# Patient Record
Sex: Female | Born: 1975
Health system: Southern US, Community
[De-identification: ages and names within clinical notes are randomized; demographics above are authoritative.]

## PROBLEM LIST (undated history)

## (undated) DIAGNOSIS — Z1501 Genetic susceptibility to malignant neoplasm of breast: Secondary | ICD-10-CM

## (undated) DIAGNOSIS — T7840XA Allergy, unspecified, initial encounter: Secondary | ICD-10-CM

## (undated) DIAGNOSIS — L409 Psoriasis, unspecified: Secondary | ICD-10-CM

## (undated) DIAGNOSIS — W57XXXA Bitten or stung by nonvenomous insect and other nonvenomous arthropods, initial encounter: Secondary | ICD-10-CM

## (undated) DIAGNOSIS — M722 Plantar fascial fibromatosis: Secondary | ICD-10-CM

## (undated) DIAGNOSIS — S90569A Insect bite (nonvenomous), unspecified ankle, initial encounter: Secondary | ICD-10-CM

## (undated) HISTORY — PX: WISDOM TOOTH EXTRACTION: SHX21

## (undated) HISTORY — DX: Allergy, unspecified, initial encounter: T78.40XA

## (undated) HISTORY — DX: Genetic susceptibility to malignant neoplasm of breast: Z15.01

## (undated) HISTORY — DX: Plantar fascial fibromatosis: M72.2

## (undated) HISTORY — DX: Psoriasis, unspecified: L40.9

## (undated) HISTORY — DX: Bitten or stung by nonvenomous insect and other nonvenomous arthropods, initial encounter: W57.XXXA

## (undated) HISTORY — DX: Insect bite (nonvenomous), unspecified ankle, initial encounter: S90.569A

---

## 2004-05-10 HISTORY — PX: ABDOMINOPLASTY: SUR9

## 2014-06-10 HISTORY — PX: ABDOMINAL HYSTERECTOMY: SHX81

## 2014-06-11 DIAGNOSIS — D509 Iron deficiency anemia, unspecified: Secondary | ICD-10-CM

## 2014-06-11 HISTORY — DX: Iron deficiency anemia, unspecified: D50.9

## 2015-03-11 HISTORY — PX: BREAST ENHANCEMENT SURGERY: SHX7

## 2015-03-11 HISTORY — PX: MASTECTOMY: SHX3

## 2015-05-08 DIAGNOSIS — Z9013 Acquired absence of bilateral breasts and nipples: Secondary | ICD-10-CM

## 2015-05-08 HISTORY — DX: Acquired absence of bilateral breasts and nipples: Z90.13

## 2017-02-07 HISTORY — PX: ROTATOR CUFF REPAIR: SHX139

## 2018-02-21 DIAGNOSIS — E6609 Other obesity due to excess calories: Secondary | ICD-10-CM | POA: Insufficient documentation

## 2018-02-21 DIAGNOSIS — E669 Obesity, unspecified: Secondary | ICD-10-CM | POA: Insufficient documentation

## 2018-12-26 DIAGNOSIS — Z1501 Genetic susceptibility to malignant neoplasm of breast: Secondary | ICD-10-CM | POA: Insufficient documentation

## 2018-12-26 DIAGNOSIS — Z1509 Genetic susceptibility to other malignant neoplasm: Secondary | ICD-10-CM | POA: Insufficient documentation

## 2018-12-26 HISTORY — DX: Genetic susceptibility to other malignant neoplasm: Z15.09

## 2018-12-26 HISTORY — DX: Genetic susceptibility to malignant neoplasm of breast: Z15.01

## 2019-11-08 DIAGNOSIS — T63001A Toxic effect of unspecified snake venom, accidental (unintentional), initial encounter: Secondary | ICD-10-CM

## 2019-11-08 HISTORY — DX: Toxic effect of unspecified snake venom, accidental (unintentional), initial encounter: T63.001A

## 2020-04-25 DIAGNOSIS — L723 Sebaceous cyst: Secondary | ICD-10-CM | POA: Diagnosis not present

## 2020-04-25 DIAGNOSIS — D485 Neoplasm of uncertain behavior of skin: Secondary | ICD-10-CM | POA: Diagnosis not present

## 2020-04-25 DIAGNOSIS — L400 Psoriasis vulgaris: Secondary | ICD-10-CM | POA: Diagnosis not present

## 2020-05-05 ENCOUNTER — Other Ambulatory Visit: Payer: Self-pay

## 2020-05-05 ENCOUNTER — Ambulatory Visit (HOSPITAL_COMMUNITY)
Admission: RE | Admit: 2020-05-05 | Discharge: 2020-05-05 | Disposition: A | Discharge status: Home / Self Care | Payer: 59 | Source: Ambulatory Visit

## 2020-05-06 DIAGNOSIS — R300 Dysuria: Secondary | ICD-10-CM | POA: Diagnosis not present

## 2020-09-10 DIAGNOSIS — W57XXXA Bitten or stung by nonvenomous insect and other nonvenomous arthropods, initial encounter: Secondary | ICD-10-CM | POA: Diagnosis not present

## 2020-09-10 DIAGNOSIS — R030 Elevated blood-pressure reading, without diagnosis of hypertension: Secondary | ICD-10-CM | POA: Diagnosis not present

## 2020-09-10 DIAGNOSIS — S90561A Insect bite (nonvenomous), right ankle, initial encounter: Secondary | ICD-10-CM | POA: Diagnosis not present

## 2020-09-23 DIAGNOSIS — L710 Perioral dermatitis: Secondary | ICD-10-CM | POA: Diagnosis not present

## 2020-09-23 DIAGNOSIS — L400 Psoriasis vulgaris: Secondary | ICD-10-CM | POA: Diagnosis not present

## 2020-09-23 DIAGNOSIS — L811 Chloasma: Secondary | ICD-10-CM | POA: Diagnosis not present

## 2020-09-29 DIAGNOSIS — Z20822 Contact with and (suspected) exposure to covid-19: Secondary | ICD-10-CM | POA: Diagnosis not present

## 2020-09-30 DIAGNOSIS — Z20822 Contact with and (suspected) exposure to covid-19: Secondary | ICD-10-CM | POA: Diagnosis not present

## 2020-10-16 DIAGNOSIS — Z803 Family history of malignant neoplasm of breast: Secondary | ICD-10-CM | POA: Diagnosis not present

## 2020-10-16 DIAGNOSIS — T8542XA Displacement of breast prosthesis and implant, initial encounter: Secondary | ICD-10-CM | POA: Diagnosis not present

## 2020-10-16 DIAGNOSIS — T85848A Pain due to other internal prosthetic devices, implants and grafts, initial encounter: Secondary | ICD-10-CM | POA: Diagnosis not present

## 2020-10-16 DIAGNOSIS — N644 Mastodynia: Secondary | ICD-10-CM | POA: Diagnosis not present

## 2020-10-16 DIAGNOSIS — R222 Localized swelling, mass and lump, trunk: Secondary | ICD-10-CM | POA: Diagnosis not present

## 2020-10-16 DIAGNOSIS — Z9013 Acquired absence of bilateral breasts and nipples: Secondary | ICD-10-CM | POA: Diagnosis not present

## 2020-10-16 DIAGNOSIS — Z1501 Genetic susceptibility to malignant neoplasm of breast: Secondary | ICD-10-CM | POA: Diagnosis not present

## 2020-10-16 DIAGNOSIS — Z87891 Personal history of nicotine dependence: Secondary | ICD-10-CM | POA: Diagnosis not present

## 2020-12-18 DIAGNOSIS — Z45811 Encounter for adjustment or removal of right breast implant: Secondary | ICD-10-CM | POA: Diagnosis not present

## 2020-12-18 DIAGNOSIS — Z1501 Genetic susceptibility to malignant neoplasm of breast: Secondary | ICD-10-CM | POA: Diagnosis not present

## 2020-12-18 DIAGNOSIS — Z87891 Personal history of nicotine dependence: Secondary | ICD-10-CM | POA: Diagnosis not present

## 2020-12-18 DIAGNOSIS — Z9882 Breast implant status: Secondary | ICD-10-CM | POA: Diagnosis not present

## 2020-12-18 HISTORY — PX: BREAST SURGERY: SHX581

## 2021-01-15 ENCOUNTER — Other Ambulatory Visit (HOSPITAL_COMMUNITY): Payer: Self-pay

## 2021-01-15 MED ORDER — MONTELUKAST SODIUM 10 MG PO TABS
10.0000 mg | ORAL_TABLET | ORAL | 0 refills | Status: DC
Start: 2020-11-20 — End: 2021-05-13
  Filled 2021-01-15: qty 30, 60d supply, fill #0

## 2021-01-19 ENCOUNTER — Other Ambulatory Visit (HOSPITAL_COMMUNITY): Payer: Self-pay

## 2021-02-06 ENCOUNTER — Other Ambulatory Visit: Payer: Self-pay

## 2021-02-06 ENCOUNTER — Ambulatory Visit: Payer: 59 | Admitting: Family Medicine

## 2021-02-06 ENCOUNTER — Encounter: Payer: Self-pay | Admitting: Family Medicine

## 2021-02-06 VITALS — BP 138/82 | HR 79 | Temp 98.2°F | Ht 67.0 in | Wt 229.0 lb

## 2021-02-06 DIAGNOSIS — Z0000 Encounter for general adult medical examination without abnormal findings: Secondary | ICD-10-CM

## 2021-02-06 DIAGNOSIS — R030 Elevated blood-pressure reading, without diagnosis of hypertension: Secondary | ICD-10-CM | POA: Diagnosis not present

## 2021-02-06 DIAGNOSIS — L409 Psoriasis, unspecified: Secondary | ICD-10-CM | POA: Diagnosis not present

## 2021-02-06 DIAGNOSIS — Z1211 Encounter for screening for malignant neoplasm of colon: Secondary | ICD-10-CM

## 2021-02-06 DIAGNOSIS — Z01419 Encounter for gynecological examination (general) (routine) without abnormal findings: Secondary | ICD-10-CM | POA: Diagnosis not present

## 2021-02-06 DIAGNOSIS — Z1509 Genetic susceptibility to other malignant neoplasm: Secondary | ICD-10-CM | POA: Diagnosis not present

## 2021-02-06 DIAGNOSIS — E785 Hyperlipidemia, unspecified: Secondary | ICD-10-CM | POA: Insufficient documentation

## 2021-02-06 DIAGNOSIS — E559 Vitamin D deficiency, unspecified: Secondary | ICD-10-CM | POA: Insufficient documentation

## 2021-02-06 DIAGNOSIS — Z1501 Genetic susceptibility to malignant neoplasm of breast: Secondary | ICD-10-CM | POA: Diagnosis not present

## 2021-02-06 DIAGNOSIS — Z13220 Encounter for screening for lipoid disorders: Secondary | ICD-10-CM

## 2021-02-06 DIAGNOSIS — Z1507 Genetic susceptibility to malignant neoplasm of urinary tract: Secondary | ICD-10-CM

## 2021-02-06 DIAGNOSIS — Z713 Dietary counseling and surveillance: Secondary | ICD-10-CM | POA: Insufficient documentation

## 2021-02-06 HISTORY — DX: Hyperlipidemia, unspecified: E78.5

## 2021-02-06 NOTE — Assessment & Plan Note (Signed)
Active issue with involvement primarily at the left elbow/proximal extensor forearm, lesser so to the right elbow, scattered patches elsewhere.  She is interested in a referral to dermatology, one was placed today in that regard.  We will follow peripherally on this issue.

## 2021-02-06 NOTE — Assessment & Plan Note (Signed)
Annual examination complete risk stratification labs ordered.  A referral to gastroenterology was placed for colon cancer screening, additionally she plans to establish with a local OB/GYN.  Anticipatory guidance was provided to the patient today.

## 2021-02-06 NOTE — Progress Notes (Signed)
Annual Physical Exam Visit  Patient Information:  Patient ID: Brooke Graves, female DOB: [birth year] Age: 45 y.o. MRN: [id number]   Subjective:   CC: Annual Physical Exam  HPI:  Brooke Graves is here for their annual physical.  I reviewed the past medical history, family history, social history, surgical history, and allergies today and changes were made as necessary.  Please see the problem list section below for additional details.  Past Medical History: Past Medical History:  Diagnosis Date   Acquired absence of both breasts 05/08/2015   Allergy    BRCA positive    BRCA2 gene mutation positive 12/26/2018   Hyperlipidemia 02/06/2021   Iron deficiency anemia 06/11/2014   Plantar fasciitis    bilateral   Poisoning, snake bite 11/2019   [place]   Psoriasis    Tick bite of ankle    right   Past Surgical History: Past Surgical History:  Procedure Laterality Date   ABDOMINAL HYSTERECTOMY  06/2014   ABDOMINOPLASTY  2006   BREAST ENHANCEMENT SURGERY Bilateral 03/2015   BREAST SURGERY Bilateral 12/18/2020   Revision   MASTECTOMY Bilateral 03/2015   ROTATOR CUFF REPAIR Right 02/2017   WISDOM TOOTH EXTRACTION Bilateral    Family History: Family History  Problem Relation Age of Onset   Breast cancer Mother    Congenital heart disease Brother    Ovarian cancer Maternal Aunt    Kidney cancer Maternal Aunt    Breast cancer Maternal Grandmother    Allergies: Allergies  Allergen Reactions   Sulfamethoxazole-Trimethoprim Rash and Palpitations   Lactase-Lactobacillus Other (See Comments)    GI INTOLERANCE   Pork-Derived Products Other (See Comments)    GI INTOLERANCE   Tape Rash   Wound Dressing Adhesive Rash   Health Maintenance: Health Maintenance  Topic Date Due   COVID-19 Vaccine (1) Never done   HIV Screening  Never done   Hepatitis C Screening  Never done   TETANUS/TDAP  Never done   COLONOSCOPY (Pts 45-36yrs Insurance coverage will  need to be confirmed)  Never done   INFLUENZA VACCINE  12/08/2020   HPV VACCINES  Aged Out   PAP SMEAR-Modifier  Discontinued    HM Colonoscopy          Overdue - COLONOSCOPY (Pts 45-53yrs Insurance coverage will need to be confirmed) (Every 10 Years) Overdue - never done    No completion history exists for this topic.           Medications: Current Outpatient Medications on File Prior to Visit  Medication Sig Dispense Refill   montelukast (SINGULAIR) 10 MG tablet Take 1 tablet (10 mg total) by mouth every other day. 45 tablet 0   No current facility-administered medications on file prior to visit.    Review of Systems: No headache, visual changes, nausea, vomiting, diarrhea, constipation, dizziness, abdominal pain, +skin rash, fevers, chills, night sweats, swollen lymph nodes, weight loss, chest pain, body aches, joint swelling, muscle aches, shortness of breath, mood changes, visual or auditory hallucinations reported.  Objective:   Vitals:   02/06/21 1527 02/06/21 1622  BP: (!) 184/112 138/82  Pulse:    Temp:    SpO2:     Vitals:   02/06/21 1520  Weight: 229 lb (103.9 kg)  Height: $Remove'5\' 7"'kFCYweV$  (1.702 m)   Body mass index is 35.87 kg/m.  General: Well Developed, well nourished, and in no acute distress.  Neuro: Alert and oriented x3, extra-ocular muscles intact, sensation grossly  intact. Cranial nerves II through XII are intact, motor, sensory, and coordinative functions are all intact. HEENT: Normocephalic, atraumatic, pupils equal round reactive to light, neck supple, no masses, no lymphadenopathy, thyroid nonpalpable.  Mallampati class II, oropharynx, nasopharynx, external ear canals are unremarkable. Skin: Warm and dry, psoriasiform rash at left elbow/proximal forearm, right elbow.  Hyperpigmented scars at right abdomen Cardiac: Regular rate and rhythm, no murmurs rubs or gallops. No peripheral edema. Pulses symmetric. Respiratory: Clear to auscultation bilaterally.  Not using accessory muscles, speaking in full sentences.  Abdominal: Soft, nontender, nondistended, positive bowel sounds, no masses, no organomegaly. Musculoskeletal: Shoulder, elbow, wrist, hip, knee, ankle stable, and with full range of motion.  Female chaperone initials: BN present throughout the physical examination.  Impression and Recommendations:   The patient was counselled, risk factors were discussed, and anticipatory guidance given.  Psoriasis Active issue with involvement primarily at the left elbow/proximal extensor forearm, lesser so to the right elbow, scattered patches elsewhere.  She is interested in a referral to dermatology, one was placed today in that regard.  We will follow peripherally on this issue.  Elevated blood-pressure reading without diagnosis of hypertension Incidentally noted on serial measurements, she is asymptomatic from a cardiovascular standpoint.  Risk stratification labs were ordered, will have the patient return in 4 weeks for reevaluation of this.  Annual physical exam Annual examination complete risk stratification labs ordered.  A referral to gastroenterology was placed for colon cancer screening, additionally she plans to establish with a local OB/GYN.  Anticipatory guidance was provided to the patient today.  Orders & Medications Medications: No orders of the defined types were placed in this encounter.  Orders Placed This Encounter  Procedures   TSH Rfx on Abnormal to Free T4   Lipid panel   Comprehensive metabolic panel   CBC   Ambulatory referral to Dermatology   Ambulatory referral to Gastroenterology   Ambulatory referral to Gynecology     Return in about 4 weeks (around 03/06/2021).    Montel Culver, MD   [facility] [place]

## 2021-02-06 NOTE — Assessment & Plan Note (Signed)
Incidentally noted on serial measurements, she is asymptomatic from a cardiovascular standpoint.  Risk stratification labs were ordered, will have the patient return in 4 weeks for reevaluation of this.

## 2021-02-06 NOTE — Patient Instructions (Addendum)
-   Obtain fasting labs with orders provided (can have water or black coffee but otherwise no food or drink x 8 hours before labs) - Referral coordinator will contact you in regards to follow-up with dermatology, gastroenterology - Review information provided - Attend eye doctor annually, dentist every 6 months, work towards or maintain 30 minutes of moderate intensity physical activity at least 5 days per week, and consume a balanced diet - Contact us for any questions between now and then

## 2021-03-05 ENCOUNTER — Ambulatory Visit: Payer: 59 | Admitting: Family Medicine

## 2021-03-05 DIAGNOSIS — Z13220 Encounter for screening for lipoid disorders: Secondary | ICD-10-CM | POA: Diagnosis not present

## 2021-03-05 DIAGNOSIS — R030 Elevated blood-pressure reading, without diagnosis of hypertension: Secondary | ICD-10-CM | POA: Diagnosis not present

## 2021-03-05 DIAGNOSIS — Z0000 Encounter for general adult medical examination without abnormal findings: Secondary | ICD-10-CM | POA: Diagnosis not present

## 2021-03-06 ENCOUNTER — Ambulatory Visit: Payer: 59 | Admitting: Family Medicine

## 2021-03-06 LAB — LIPID PANEL
Chol/HDL Ratio: 3.3 ratio (ref 0.0–4.4)
Cholesterol, Total: 207 mg/dL — ABNORMAL HIGH (ref 100–199)
HDL: 63 mg/dL (ref >39)
LDL Chol Calc (NIH): 128 mg/dL — ABNORMAL HIGH (ref 0–99)
Triglycerides: 89 mg/dL (ref 0–149)
VLDL Cholesterol Cal: 16 mg/dL (ref 5–40)

## 2021-03-06 LAB — CBC
Hematocrit: 46.8 % — ABNORMAL HIGH (ref 34.0–46.6)
Hemoglobin: 15.4 g/dL (ref 11.1–15.9)
MCH: 27.2 pg (ref 26.6–33.0)
MCHC: 32.9 g/dL (ref 31.5–35.7)
MCV: 83 fL (ref 79–97)
Platelets: 304 10*3/uL (ref 150–450)
RBC: 5.67 x10E6/uL — ABNORMAL HIGH (ref 3.77–5.28)
RDW: 12.7 % (ref 11.7–15.4)
WBC: 5.5 10*3/uL (ref 3.4–10.8)

## 2021-03-06 LAB — COMPREHENSIVE METABOLIC PANEL
ALT: 21 IU/L (ref 0–32)
AST: 24 IU/L (ref 0–40)
Albumin/Globulin Ratio: 1.9 (ref 1.2–2.2)
Albumin: 4.8 g/dL (ref 3.8–4.8)
Alkaline Phosphatase: 103 IU/L (ref 44–121)
BUN/Creatinine Ratio: 10 (ref 9–23)
BUN: 9 mg/dL (ref 6–24)
Bilirubin Total: 0.5 mg/dL (ref 0.0–1.2)
CO2: 23 mmol/L (ref 20–29)
Calcium: 9.1 mg/dL (ref 8.7–10.2)
Chloride: 103 mmol/L (ref 96–106)
Creatinine, Ser: 0.86 mg/dL (ref 0.57–1.00)
Globulin, Total: 2.5 g/dL (ref 1.5–4.5)
Glucose: 90 mg/dL (ref 70–99)
Potassium: 4.3 mmol/L (ref 3.5–5.2)
Sodium: 141 mmol/L (ref 134–144)
Total Protein: 7.3 g/dL (ref 6.0–8.5)
eGFR: 84 mL/min/{1.73_m2} (ref >59)

## 2021-03-06 LAB — TSH RFX ON ABNORMAL TO FREE T4: TSH: 1.61 u[IU]/mL (ref 0.450–4.500)

## 2021-03-12 ENCOUNTER — Other Ambulatory Visit: Payer: Self-pay

## 2021-03-12 ENCOUNTER — Other Ambulatory Visit (HOSPITAL_COMMUNITY): Payer: Self-pay

## 2021-03-12 ENCOUNTER — Ambulatory Visit: Payer: 59 | Admitting: Family Medicine

## 2021-03-12 ENCOUNTER — Encounter: Payer: Self-pay | Admitting: Family Medicine

## 2021-03-12 VITALS — BP 104/74 | HR 72 | Temp 98.6°F | Ht 67.0 in | Wt 223.0 lb

## 2021-03-12 DIAGNOSIS — R030 Elevated blood-pressure reading, without diagnosis of hypertension: Secondary | ICD-10-CM

## 2021-03-12 DIAGNOSIS — R439 Unspecified disturbances of smell and taste: Secondary | ICD-10-CM | POA: Diagnosis not present

## 2021-03-12 DIAGNOSIS — Z113 Encounter for screening for infections with a predominantly sexual mode of transmission: Secondary | ICD-10-CM | POA: Insufficient documentation

## 2021-03-12 MED ORDER — FLUTICASONE PROPIONATE 50 MCG/ACT NA SUSP
2.0000 | Freq: Every day | NASAL | 0 refills | Status: DC
  Filled 2021-03-12: qty 16, 30d supply, fill #0

## 2021-03-12 NOTE — Assessment & Plan Note (Signed)
Patient starting new relationship and is asymptomatic, requesting routine screening for STI, these labs were ordered today.

## 2021-03-12 NOTE — Patient Instructions (Signed)
-   Obtain labs with orders provided, our office will contact you with results once available - Start intranasal OTC saline spray, following this use Flonase (fluticasone propionate) 2 sprays in each nostril daily x2-4 weeks - Follow-up with ENT -Return for follow-up in our office in 3-6 months, contact for questions

## 2021-03-12 NOTE — Progress Notes (Signed)
Primary Care / Sports Medicine Office Visit  Patient Information:  Patient ID: Brooke Graves, female DOB: 13-May-1975 Age: 45 y.o. MRN: [id number]   Brooke Graves is a pleasant 45 y.o. female presenting with the following:  Chief Complaint  Patient presents with   Follow-up    Reevaluate blood pressure    Review of Systems pertinent details above   Patient Active Problem List   Diagnosis Date Noted   Sense of smell altered 03/12/2021   Routine screening for STI (sexually transmitted infection) 03/12/2021   Elevated blood-pressure reading without diagnosis of hypertension 02/06/2021   Dietary counseling and surveillance 02/06/2021   Vitamin D deficiency 02/06/2021   Annual physical exam 02/06/2021   Psoriasis    Obesity 02/21/2018   Past Medical History:  Diagnosis Date   Acquired absence of both breasts 05/08/2015   Allergy    BRCA positive    BRCA2 gene mutation positive 12/26/2018   Hyperlipidemia 02/06/2021   Iron deficiency anemia 06/11/2014   Plantar fasciitis    bilateral   Poisoning, snake bite 11/2019   [place]   Psoriasis    Tick bite of ankle    right   Outpatient Encounter Medications as of 03/12/2021  Medication Sig   fluticasone (FLONASE) 50 MCG/ACT nasal spray Place 2 sprays into both nostrils daily.   BLACK COHOSH PO Take 1 capsule by mouth daily. (Patient not taking: Reported on 03/12/2021)   montelukast (SINGULAIR) 10 MG tablet Take 1 tablet (10 mg total) by mouth every other day. (Patient not taking: Reported on 03/12/2021)   PARoxetine (PAXIL) 20 MG tablet Take 20 mg by mouth daily. (Patient not taking: Reported on 03/12/2021)   VITAMIN D PO Take 1 capsule by mouth daily. (Patient not taking: Reported on 03/12/2021)   No facility-administered encounter medications on file as of 03/12/2021.   Past Surgical History:  Procedure Laterality Date   ABDOMINAL HYSTERECTOMY  06/2014   ABDOMINOPLASTY  2006   BREAST ENHANCEMENT  SURGERY Bilateral 03/2015   BREAST SURGERY Bilateral 12/18/2020   Revision   MASTECTOMY Bilateral 03/2015   ROTATOR CUFF REPAIR Right 02/2017   WISDOM TOOTH EXTRACTION Bilateral     Vitals:   03/12/21 1032  BP: 104/74  Pulse: 72  Temp: 98.6 F (37 C)  SpO2: 97%   Vitals:   03/12/21 1032  Weight: 223 lb (101.2 kg)  Height: $Remove'5\' 7"'ThFrGyo$  (1.702 m)   Body mass index is 34.93 kg/m.  No results found.   Independent interpretation of notes and tests performed by another provider:   None  Procedures performed:   None  Pertinent History, Exam, Impression, and Recommendations:   Elevated blood-pressure reading without diagnosis of hypertension Previously noted on prior exam, she remains asymptomatic, cardiopulmonary findings on examination are benign as his her blood pressure measured today.  We will follow this issue in the future.  Sense of smell altered Patient states that roughly 2 years prior, following COVID at that time, she had anosmia, since that time she has noted gradual partial return of her sense of smell though has noted alteration in it.  She states that certain items/foods (onions, cilantro, laundry detergents, perfumes, etc.) now are somewhat noxious where they previously had not been.  Examination today reveals subtle tenderness at the frontal and ethmoid sinus ease, left turbinate swelling and mild erythema, right with mild erythema along bilateral tympanic membranes and canals benign, oropharynx benign.  Have advised initial management with saline, intranasal  steroid, and pending residual symptomatology, follow-up with ENT.  A referral was placed in that regard today.  Routine screening for STI (sexually transmitted infection) Patient starting new relationship and is asymptomatic, requesting routine screening for STI, these labs were ordered today.   Orders & Medications Meds ordered this encounter  Medications   fluticasone (FLONASE) 50 MCG/ACT nasal spray     Sig: Place 2 sprays into both nostrils daily.    Dispense:  16 g    Refill:  0   Orders Placed This Encounter  Procedures   Chlamydia/Gonococcus/Trichomonas, NAA   HIV antibody (with reflex)   Hepatitis C Antibody   RPR   Ambulatory referral to ENT     Return in about 3 months (around 06/12/2021) for 3-6 month follow-up .     Montel Culver, MD   [facility] [place]

## 2021-03-12 NOTE — Assessment & Plan Note (Signed)
Previously noted on prior exam, she remains asymptomatic, cardiopulmonary findings on examination are benign as his her blood pressure measured today.  We will follow this issue in the future.

## 2021-03-12 NOTE — Assessment & Plan Note (Signed)
Patient states that roughly 2 years prior, following COVID at that time, she had anosmia, since that time she has noted gradual partial return of her sense of smell though has noted alteration in it.  She states that certain items/foods (onions, cilantro, laundry detergents, perfumes, etc.) now are somewhat noxious where they previously had not been.  Examination today reveals subtle tenderness at the frontal and ethmoid sinus ease, left turbinate swelling and mild erythema, right with mild erythema along bilateral tympanic membranes and canals benign, oropharynx benign.  Have advised initial management with saline, intranasal steroid, and pending residual symptomatology, follow-up with ENT.  A referral was placed in that regard today.

## 2021-03-13 LAB — HIV ANTIBODY (ROUTINE TESTING W REFLEX): HIV Screen 4th Generation wRfx: NONREACTIVE

## 2021-03-13 LAB — HEPATITIS C ANTIBODY: Hep C Virus Ab: <0.1 s/co ratio (ref 0.0–0.9)

## 2021-03-13 LAB — RPR: RPR Ser Ql: NONREACTIVE

## 2021-03-14 LAB — CHLAMYDIA/GONOCOCCUS/TRICHOMONAS, NAA
Chlamydia by NAA: NEGATIVE
Gonococcus by NAA: NEGATIVE
Trich vag by NAA: NEGATIVE

## 2021-03-20 ENCOUNTER — Other Ambulatory Visit (HOSPITAL_COMMUNITY): Payer: Self-pay

## 2021-05-12 ENCOUNTER — Ambulatory Visit: Payer: Self-pay

## 2021-05-12 NOTE — Telephone Encounter (Signed)
Patient called, left VM to return the call to the office to discuss symptoms with a nurse.    Summary: sore throat, loss of voice since 05/07/21   Patient called in to say that she have scheduled an appointment for 05/13/21 but would like something today if possible. Took a Covid test on 05/05/21 which was positive but have no more symptoms except cough, sore throat  and loss of voice since 05/07/21. Taking 600 MG ibuprofen every 6 hrs and Tylenol. Asking for a call back at Ph# [phone]

## 2021-05-12 NOTE — Telephone Encounter (Signed)
°  Chief Complaint: sore throat Symptoms: sore throat, cough, hoarse voice Frequency: 6-7 days Pertinent Negatives: Patient denies fever Disposition: [] ED /[] Urgent Care (no appt availability in office) / [x] Appointment(In office/virtual)/ []  [facility]/ [] Home Care/ [] Refused Recommended Disposition /[] [facility]/ []  Follow-up with PCP Additional Notes: Agent had already scheduled pt for VV at 05/13/21 1140. Advised pt I can schedule for virtual UC visit if she cant wait until visit tomorrow but pt wants to wait. Care advice given and pt verbalized understanding. No other questions/concerns noted.     Reason for Disposition  [1] Sore throat with cough/cold symptoms AND [2] present > 5 days  Answer Assessment - Initial Assessment Questions 1. ONSET: "When did the throat start hurting?" (Hours or days ago)      05/05/21 2. SEVERITY: "How bad is the sore throat?" (Scale 1-10; mild, moderate or severe)   - MILD (1-3):  doesn't interfere with eating or normal activities   - MODERATE (4-7): interferes with eating some solids and normal activities   - SEVERE (8-10):  excruciating pain, interferes with most normal activities   - SEVERE DYSPHAGIA: can't swallow liquids, drooling     mild 4.  VIRAL SYMPTOMS: "Are there any symptoms of a cold, such as a runny nose, cough, hoarse voice or red eyes?"      Hoarse voice, cough  5. FEVER: "Do you have a fever?" If Yes, ask: "What is your temperature, how was it measured, and when did it start?"     no 6. PUS ON THE TONSILS: "Is there pus on the tonsils in the back of your throat?"     No 7. OTHER SYMPTOMS: "Do you have any other symptoms?" (e.g., difficulty breathing, headache, rash)     No  Protocols used: Sore Throat-A-AH

## 2021-05-13 ENCOUNTER — Encounter: Payer: Self-pay | Admitting: Family Medicine

## 2021-05-13 ENCOUNTER — Telehealth (INDEPENDENT_AMBULATORY_CARE_PROVIDER_SITE_OTHER): Payer: 59 | Admitting: Family Medicine

## 2021-05-13 ENCOUNTER — Other Ambulatory Visit (HOSPITAL_COMMUNITY): Payer: Self-pay

## 2021-05-13 ENCOUNTER — Other Ambulatory Visit: Payer: Self-pay

## 2021-05-13 VITALS — Temp 97.9°F | Ht 67.0 in | Wt 224.0 lb

## 2021-05-13 DIAGNOSIS — J040 Acute laryngitis: Secondary | ICD-10-CM | POA: Insufficient documentation

## 2021-05-13 MED ORDER — METHYLPREDNISOLONE 4 MG PO TBPK
ORAL_TABLET | ORAL | 0 refills | Status: DC
  Filled 2021-05-13: qty 21, 6d supply, fill #0

## 2021-05-13 MED ORDER — PROMETHAZINE-DM 6.25-15 MG/5ML PO SYRP
5.0000 mL | ORAL_SOLUTION | Freq: Four times a day (QID) | ORAL | 0 refills | Status: DC | PRN
  Filled 2021-05-13: qty 118, 6d supply, fill #0

## 2021-05-13 NOTE — Patient Instructions (Addendum)
-   Take Medrol Dosepak for full 6-day course - Can use cough medicine every 6 hours on as-needed basis - Get plenty of rest, stay hydrated, and review information provided on other supportive measures - Contact our office next week if symptoms fail to improve

## 2021-05-13 NOTE — Progress Notes (Signed)
Primary Care / Sports Medicine Virtual Visit  Patient Information:  Patient ID: Brooke Graves, female DOB: 05-29-1975 Age: 46 y.o. MRN: [id number]   Der A Higuera Cassell Clement is a pleasant 46 y.o. female presenting with the following:  Chief Complaint  Patient presents with   Covid Positive    05/05/21   Hoarse    Experiencing laryngitis, sore throat, and cough since 05/07/21; using chloraseptic, lozenges, ibuprofen with minimal relief; 8/10 throat pain    Review of Systems: No fevers, chills, night sweats, weight loss, chest pain, or shortness of breath.   Patient Active Problem List   Diagnosis Date Noted   Acute laryngitis 05/13/2021   Sense of smell altered 03/12/2021   Routine screening for STI (sexually transmitted infection) 03/12/2021   Elevated blood-pressure reading without diagnosis of hypertension 02/06/2021   Dietary counseling and surveillance 02/06/2021   Vitamin D deficiency 02/06/2021   Annual physical exam 02/06/2021   Psoriasis    Obesity 02/21/2018   Past Medical History:  Diagnosis Date   Acquired absence of both breasts 05/08/2015   Allergy    BRCA positive    BRCA2 gene mutation positive 12/26/2018   Hyperlipidemia 02/06/2021   Iron deficiency anemia 06/11/2014   Plantar fasciitis    bilateral   Poisoning, snake bite 11/2019   [place]   Psoriasis    Tick bite of ankle    right   Outpatient Encounter Medications as of 05/13/2021  Medication Sig   methylPREDNISolone (MEDROL DOSEPAK) 4 MG TBPK tablet Take as directed on patient instructions   promethazine-dextromethorphan (PROMETHAZINE-DM) 6.25-15 MG/5ML syrup Take 5 mLs by mouth 4 (four) times daily as needed for cough.   Saline Spray 0.2 % SOLN Place 1 spray into the nose 3 (three) times daily as needed.   fluticasone (FLONASE) 50 MCG/ACT nasal spray Place 2 sprays into both nostrils daily. (Patient not taking: Reported on 05/13/2021)   [DISCONTINUED] BLACK COHOSH PO Take 1 capsule by  mouth daily. (Patient not taking: Reported on 03/12/2021)   [DISCONTINUED] montelukast (SINGULAIR) 10 MG tablet Take 1 tablet (10 mg total) by mouth every other day. (Patient not taking: Reported on 03/12/2021)   [DISCONTINUED] PARoxetine (PAXIL) 20 MG tablet Take 20 mg by mouth daily. (Patient not taking: Reported on 03/12/2021)   [DISCONTINUED] VITAMIN D PO Take 1 capsule by mouth daily. (Patient not taking: Reported on 03/12/2021)   No facility-administered encounter medications on file as of 05/13/2021.   Past Surgical History:  Procedure Laterality Date   ABDOMINAL HYSTERECTOMY  06/2014   ABDOMINOPLASTY  2006   BREAST ENHANCEMENT SURGERY Bilateral 03/2015   BREAST SURGERY Bilateral 12/18/2020   Revision   MASTECTOMY Bilateral 03/2015   ROTATOR CUFF REPAIR Right 02/2017   WISDOM TOOTH EXTRACTION Bilateral     Virtual Visit via MyChart Video:   I connected with Brooke Graves on 05/13/21 via MyChart Video and verified that I am speaking with the correct person using appropriate identifiers.   The limitations, risks, security and privacy concerns of performing an evaluation and management service by MyChart Video, including the higher likelihood of inaccurate diagnoses and treatments, and the availability of in person appointments were reviewed. The possible need of an additional face-to-face encounter for complete and high quality delivery of care was discussed. The patient was also made aware that there may be a patient responsible charge related to this service. The patient expressed understanding and wishes to proceed.  Provider location is in  medical facility. Patient location is at their home, different from provider location. People involved in care of the patient during this telehealth encounter were myself, my nurse/medical assistant, and my front office/scheduling team member.  Objective findings:   General: Speaking full sentences, hoarse voice, no audible heavy  breathing. Sounds alert and appropriately interactive. Mildly ill-appearing. Face symmetric. Extraocular movements intact. Pupils equal and round. No nasal flaring or accessory muscle use visualized.  Independent interpretation of notes and tests performed by another provider:   None  Pertinent History, Exam, Impression, and Recommendations:   Acute laryngitis Patient with clinical course involving symptoms that began on 05/04/2021 (malaise, fever, cough), took a home COVID test on 05/05/2021 which was positive.  Fevers have resolved x5 days but primary symptomatology has been throat pain, hoarseness, bilateral ear pain, congestion, nonproductive cough, mild sinus pressure without purulent discharge.  She denies any shortness of air, no other systemic complaints.  Given her clinical course concern for secondary/superimposed viral etiology resulting in laryngitis, could also be considered a sequelae of resolving COVID infection.  She has been dosing ibuprofen and supportive care with short-lived relief.  I discussed treatment strategies and she will start a course of methylprednisolone, as needed promethazine dextromethorphan, and continue supportive care.  She was advised to contact her office towards the beginning of next week if symptoms persist, at that time bacterial component can be considered and addressed with antibiotics.  She can otherwise follow-up as needed.   Orders & Medications Meds ordered this encounter  Medications   methylPREDNISolone (MEDROL DOSEPAK) 4 MG TBPK tablet    Sig: Take as directed on patient instructions    Dispense:  21 tablet    Refill:  0   promethazine-dextromethorphan (PROMETHAZINE-DM) 6.25-15 MG/5ML syrup    Sig: Take 5 mLs by mouth 4 (four) times daily as needed for cough.    Dispense:  118 mL    Refill:  0   No orders of the defined types were placed in this encounter.    I discussed the above assessment and treatment plan with the patient. The  patient was provided an opportunity to ask questions and all were answered. The patient agreed with the plan and demonstrated an understanding of the instructions.   The patient was advised to call back or seek an in-person evaluation if the symptoms worsen or if the condition fails to improve as anticipated.    Montel Culver, MD   Primary Care Sports [facility] [place]

## 2021-05-13 NOTE — Assessment & Plan Note (Signed)
Patient with clinical course involving symptoms that began on 05/04/2021 (malaise, fever, cough), took a home COVID test on 05/05/2021 which was positive.  Fevers have resolved x5 days but primary symptomatology has been throat pain, hoarseness, bilateral ear pain, congestion, nonproductive cough, mild sinus pressure without purulent discharge.  She denies any shortness of air, no other systemic complaints.  Given her clinical course concern for secondary/superimposed viral etiology resulting in laryngitis, could also be considered a sequelae of resolving COVID infection.  She has been dosing ibuprofen and supportive care with short-lived relief.  I discussed treatment strategies and she will start a course of methylprednisolone, as needed promethazine dextromethorphan, and continue supportive care.  She was advised to contact her office towards the beginning of next week if symptoms persist, at that time bacterial component can be considered and addressed with antibiotics.  She can otherwise follow-up as needed.

## 2021-06-22 ENCOUNTER — Other Ambulatory Visit (HOSPITAL_COMMUNITY): Payer: Self-pay

## 2021-06-22 DIAGNOSIS — L710 Perioral dermatitis: Secondary | ICD-10-CM | POA: Diagnosis not present

## 2021-06-22 DIAGNOSIS — L409 Psoriasis, unspecified: Secondary | ICD-10-CM | POA: Diagnosis not present

## 2021-06-25 ENCOUNTER — Other Ambulatory Visit (HOSPITAL_COMMUNITY): Payer: Self-pay

## 2021-06-25 MED ORDER — CLOBETASOL PROPIONATE 0.05 % EX OINT
1.0000 "application " | TOPICAL_OINTMENT | Freq: Two times a day (BID) | CUTANEOUS | 0 refills | Status: AC
  Filled 2021-06-25: qty 45, 14d supply, fill #0

## 2021-06-30 ENCOUNTER — Other Ambulatory Visit (HOSPITAL_COMMUNITY): Payer: Self-pay

## 2021-08-27 DIAGNOSIS — L719 Rosacea, unspecified: Secondary | ICD-10-CM | POA: Insufficient documentation

## 2021-08-28 ENCOUNTER — Encounter: Payer: Self-pay | Admitting: Family Medicine

## 2021-08-28 ENCOUNTER — Ambulatory Visit: Payer: 59 | Admitting: Family Medicine

## 2021-08-28 VITALS — BP 124/78 | HR 67 | Ht 67.0 in | Wt 224.6 lb

## 2021-08-28 DIAGNOSIS — Z7689 Persons encountering health services in other specified circumstances: Secondary | ICD-10-CM | POA: Diagnosis not present

## 2021-08-28 DIAGNOSIS — R030 Elevated blood-pressure reading, without diagnosis of hypertension: Secondary | ICD-10-CM | POA: Diagnosis not present

## 2021-08-28 DIAGNOSIS — R439 Unspecified disturbances of smell and taste: Secondary | ICD-10-CM

## 2021-08-28 DIAGNOSIS — Z6835 Body mass index (BMI) 35.0-35.9, adult: Secondary | ICD-10-CM | POA: Diagnosis not present

## 2021-08-28 MED ORDER — WEGOVY 0.5 MG/0.5ML ~~LOC~~ SOAJ: 0.5000 mg | SUBCUTANEOUS | 0 refills | Status: DC

## 2021-08-28 NOTE — Assessment & Plan Note (Signed)
Chronic condition for which patient has attempted multiple lifestyle changes that are ongoing including increased exercise, dietary modifications.  Despite this her BMI remains elevated at 35.18 as measured today.  She has trialed phentermine in the past but could not tolerate this medication due to adverse effects/medication sensitivity. ? ?Cardiopulmonary examination benign today, abdomen is soft, nontender, nondistended, normoactive bowel sounds noted without hepatosplenomegaly. ? ?We did discuss additional pharmacologic and nonpharmacologic treatment strategies today and she is amenable to initiation of Wegovy 0.25 mg administered weekly, first dose administered today.  Risk profile reviewed and need for adjunct continued lifestyle changes discussed.  Rx sent for 0.5 mg dose to initiate in 1 month, patient will return for formal visit at that time for weight check and assessment of medication tolerance. ? ?Chronic condition, symptomatic, Rx management ?

## 2021-08-28 NOTE — Assessment & Plan Note (Signed)
This has spontaneously resolved and given the patient's clinical timeline, can most likely be considered secondary to COVID infection. ?

## 2021-08-28 NOTE — Assessment & Plan Note (Signed)
See additional assessment(s) for plan details. 

## 2021-08-28 NOTE — Assessment & Plan Note (Signed)
Serially noted reassuring BP readings, she is asymptomatic from a cardiopulmonary standpoint ?

## 2021-08-28 NOTE — Patient Instructions (Signed)
-   Repeat Wegovy injections weekly ?- Review information attached to [place] visit ?- Return for follow-up in 4 weeks ?- [facility] nutritionist for additional information on dietary changes ?- Continue your current healthy lifestyle modifications from a dietary and exercise standpoint ?

## 2021-08-28 NOTE — Progress Notes (Signed)
?  ? ?  Primary Care / Sports Medicine Office Visit ? ?Patient Information:  ?Patient ID: Brooke Graves, female DOB: 1976/01/01 Age: 46 y.o. MRN: [id number]  ? ?Brooke Graves is a pleasant 46 y.o. female presenting with the following: ? ?Chief Complaint  ?Patient presents with  ? Follow-up  ?  Psoriasis-under control sees dermatology ?Sense of smell-thinks it has got better since having Covid in January  ?HBP-unsure   ? ? ?Vitals:  ? 08/28/21 1128  ?BP: 124/78  ?Pulse: 67  ?SpO2: 98%  ? ?Vitals:  ? 08/28/21 1128  ?Weight: 224 lb 9.6 oz (101.9 kg)  ?Height: '5\' 7"'$  (1.702 m)  ? ?Body mass index is 35.18 kg/m?. ? ?No results found.  ? ?Independent interpretation of notes and tests performed by another provider:  ? ?None ? ?Procedures performed:  ? ?None ? ?Pertinent History, Exam, Impression, and Recommendations:  ? ?Problem List Items Addressed This Visit   ? ?  ? Other  ? Elevated blood-pressure reading without diagnosis of hypertension  ?  Serially noted reassuring BP readings, she is asymptomatic from a cardiopulmonary standpoint ? ?  ?  ? Severe obesity (BMI 35.0-35.9 with comorbidity) ([place]) - Primary  ?  Chronic condition for which patient has attempted multiple lifestyle changes that are ongoing including increased exercise, dietary modifications.  Despite this her BMI remains elevated at 35.18 as measured today.  She has trialed phentermine in the past but could not tolerate this medication due to adverse effects/medication sensitivity. ? ?Cardiopulmonary examination benign today, abdomen is soft, nontender, nondistended, normoactive bowel sounds noted without hepatosplenomegaly. ? ?We did discuss additional pharmacologic and nonpharmacologic treatment strategies today and she is amenable to initiation of Wegovy 0.25 mg administered weekly, first dose administered today.  Risk profile reviewed and need for adjunct continued lifestyle changes discussed.  Rx sent for 0.5 mg dose to initiate in 1  month, patient will return for formal visit at that time for weight check and assessment of medication tolerance. ? ?Chronic condition, symptomatic, Rx management ? ?  ?  ? Relevant Medications  ? WEGOVY 0.5 MG/0.5ML SOAJ  ? Sense of smell altered  ?  This has spontaneously resolved and given the patient's clinical timeline, can most likely be considered secondary to COVID infection. ? ?  ?  ? Encounter for weight management  ?  See additional assessment(s) for plan details. ? ?  ?  ? Relevant Medications  ? WEGOVY 0.5 MG/0.5ML SOAJ  ?  ? ?Orders & Medications ?Meds ordered this encounter  ?Medications  ? WEGOVY 0.5 MG/0.5ML SOAJ  ?  Sig: Inject 0.5 mg into the skin once a week. Use this dose for 1 month (4 shots) and then increase to next higher dose.  ?  Dispense:  2 mL  ?  Refill:  0  ? ?No orders of the defined types were placed in this encounter. ?  ? ?Return in about 4 weeks (around 09/25/2021).  ?  ? ?Montel Culver, MD ? ? Primary Care Sports Medicine ?[facility] ?[place]  ? ?

## 2021-08-28 NOTE — Assessment & Plan Note (Signed)
>>  ASSESSMENT AND PLAN FOR SEVERE OBESITY (BMI 35.0-35.9 WITH COMORBIDITY) ([facility]) WRITTEN ON 08/28/2021  1:32 PM BY Blakelee Allington J, MD  Chronic condition for which patient has attempted multiple lifestyle changes that are ongoing including increased exercise, dietary modifications.  Despite this her BMI remains elevated at 35.18 as measured today.  She has trialed phentermine in the past but could not tolerate this medication due to adverse effects/medication sensitivity.  Cardiopulmonary examination benign today, abdomen is soft, nontender, nondistended, normoactive bowel sounds noted without hepatosplenomegaly.  We did discuss additional pharmacologic and nonpharmacologic treatment strategies today and she is amenable to initiation of Wegovy  0.25 mg administered weekly, first dose administered today.  Risk profile reviewed and need for adjunct continued lifestyle changes discussed.  Rx sent for 0.5 mg dose to initiate in 1 month, patient will return for formal visit at that time for weight check and assessment of medication tolerance.  Chronic condition, symptomatic, Rx management

## 2021-09-05 DIAGNOSIS — M9902 Segmental and somatic dysfunction of thoracic region: Secondary | ICD-10-CM | POA: Diagnosis not present

## 2021-09-05 DIAGNOSIS — M546 Pain in thoracic spine: Secondary | ICD-10-CM | POA: Diagnosis not present

## 2021-09-05 DIAGNOSIS — M9905 Segmental and somatic dysfunction of pelvic region: Secondary | ICD-10-CM | POA: Diagnosis not present

## 2021-09-05 DIAGNOSIS — M2560 Stiffness of unspecified joint, not elsewhere classified: Secondary | ICD-10-CM | POA: Diagnosis not present

## 2021-09-05 DIAGNOSIS — M5416 Radiculopathy, lumbar region: Secondary | ICD-10-CM | POA: Diagnosis not present

## 2021-09-05 DIAGNOSIS — M62830 Muscle spasm of back: Secondary | ICD-10-CM | POA: Diagnosis not present

## 2021-09-05 DIAGNOSIS — M9903 Segmental and somatic dysfunction of lumbar region: Secondary | ICD-10-CM | POA: Diagnosis not present

## 2021-09-05 DIAGNOSIS — M9906 Segmental and somatic dysfunction of lower extremity: Secondary | ICD-10-CM | POA: Diagnosis not present

## 2021-09-05 DIAGNOSIS — M79652 Pain in left thigh: Secondary | ICD-10-CM | POA: Diagnosis not present

## 2021-09-07 ENCOUNTER — Ambulatory Visit
Admission: RE | Admit: 2021-09-07 | Discharge: 2021-09-07 | Disposition: A | Discharge status: Home / Self Care | Payer: 59 | Source: Ambulatory Visit | Attending: Chiropractic Medicine | Admitting: Chiropractic Medicine

## 2021-09-07 ENCOUNTER — Other Ambulatory Visit: Payer: Self-pay | Admitting: Chiropractic Medicine

## 2021-09-07 DIAGNOSIS — M546 Pain in thoracic spine: Secondary | ICD-10-CM | POA: Diagnosis not present

## 2021-09-07 DIAGNOSIS — M5116 Intervertebral disc disorders with radiculopathy, lumbar region: Secondary | ICD-10-CM | POA: Diagnosis not present

## 2021-09-07 DIAGNOSIS — M5136 Other intervertebral disc degeneration, lumbar region: Secondary | ICD-10-CM | POA: Diagnosis not present

## 2021-09-07 DIAGNOSIS — M9902 Segmental and somatic dysfunction of thoracic region: Secondary | ICD-10-CM | POA: Diagnosis not present

## 2021-09-07 DIAGNOSIS — M9906 Segmental and somatic dysfunction of lower extremity: Secondary | ICD-10-CM | POA: Diagnosis not present

## 2021-09-07 DIAGNOSIS — M47816 Spondylosis without myelopathy or radiculopathy, lumbar region: Secondary | ICD-10-CM | POA: Diagnosis not present

## 2021-09-07 DIAGNOSIS — M79652 Pain in left thigh: Secondary | ICD-10-CM | POA: Diagnosis not present

## 2021-09-07 DIAGNOSIS — R1901 Right upper quadrant abdominal swelling, mass and lump: Secondary | ICD-10-CM | POA: Diagnosis not present

## 2021-09-07 DIAGNOSIS — M62830 Muscle spasm of back: Secondary | ICD-10-CM | POA: Diagnosis not present

## 2021-09-07 DIAGNOSIS — M545 Low back pain, unspecified: Secondary | ICD-10-CM

## 2021-09-07 DIAGNOSIS — M2560 Stiffness of unspecified joint, not elsewhere classified: Secondary | ICD-10-CM | POA: Diagnosis not present

## 2021-09-07 DIAGNOSIS — M9905 Segmental and somatic dysfunction of pelvic region: Secondary | ICD-10-CM | POA: Diagnosis not present

## 2021-09-07 DIAGNOSIS — M9903 Segmental and somatic dysfunction of lumbar region: Secondary | ICD-10-CM | POA: Diagnosis not present

## 2021-09-10 DIAGNOSIS — M546 Pain in thoracic spine: Secondary | ICD-10-CM | POA: Diagnosis not present

## 2021-09-10 DIAGNOSIS — M9906 Segmental and somatic dysfunction of lower extremity: Secondary | ICD-10-CM | POA: Diagnosis not present

## 2021-09-10 DIAGNOSIS — M2560 Stiffness of unspecified joint, not elsewhere classified: Secondary | ICD-10-CM | POA: Diagnosis not present

## 2021-09-10 DIAGNOSIS — M9902 Segmental and somatic dysfunction of thoracic region: Secondary | ICD-10-CM | POA: Diagnosis not present

## 2021-09-10 DIAGNOSIS — M5116 Intervertebral disc disorders with radiculopathy, lumbar region: Secondary | ICD-10-CM | POA: Diagnosis not present

## 2021-09-10 DIAGNOSIS — M5136 Other intervertebral disc degeneration, lumbar region: Secondary | ICD-10-CM | POA: Diagnosis not present

## 2021-09-10 DIAGNOSIS — M9905 Segmental and somatic dysfunction of pelvic region: Secondary | ICD-10-CM | POA: Diagnosis not present

## 2021-09-10 DIAGNOSIS — M9903 Segmental and somatic dysfunction of lumbar region: Secondary | ICD-10-CM | POA: Diagnosis not present

## 2021-09-10 DIAGNOSIS — M62830 Muscle spasm of back: Secondary | ICD-10-CM | POA: Diagnosis not present

## 2021-09-14 DIAGNOSIS — M5116 Intervertebral disc disorders with radiculopathy, lumbar region: Secondary | ICD-10-CM | POA: Diagnosis not present

## 2021-09-14 DIAGNOSIS — M9905 Segmental and somatic dysfunction of pelvic region: Secondary | ICD-10-CM | POA: Diagnosis not present

## 2021-09-14 DIAGNOSIS — M5136 Other intervertebral disc degeneration, lumbar region: Secondary | ICD-10-CM | POA: Diagnosis not present

## 2021-09-14 DIAGNOSIS — M9903 Segmental and somatic dysfunction of lumbar region: Secondary | ICD-10-CM | POA: Diagnosis not present

## 2021-09-14 DIAGNOSIS — M9906 Segmental and somatic dysfunction of lower extremity: Secondary | ICD-10-CM | POA: Diagnosis not present

## 2021-09-14 DIAGNOSIS — M2560 Stiffness of unspecified joint, not elsewhere classified: Secondary | ICD-10-CM | POA: Diagnosis not present

## 2021-09-14 DIAGNOSIS — M62830 Muscle spasm of back: Secondary | ICD-10-CM | POA: Diagnosis not present

## 2021-09-14 DIAGNOSIS — M9902 Segmental and somatic dysfunction of thoracic region: Secondary | ICD-10-CM | POA: Diagnosis not present

## 2021-09-14 DIAGNOSIS — M546 Pain in thoracic spine: Secondary | ICD-10-CM | POA: Diagnosis not present

## 2021-09-21 ENCOUNTER — Encounter: Payer: Self-pay | Admitting: Family Medicine

## 2021-09-21 ENCOUNTER — Other Ambulatory Visit: Payer: Self-pay

## 2021-09-21 ENCOUNTER — Other Ambulatory Visit (HOSPITAL_COMMUNITY): Payer: Self-pay

## 2021-09-21 DIAGNOSIS — M9903 Segmental and somatic dysfunction of lumbar region: Secondary | ICD-10-CM | POA: Diagnosis not present

## 2021-09-21 DIAGNOSIS — M546 Pain in thoracic spine: Secondary | ICD-10-CM | POA: Diagnosis not present

## 2021-09-21 DIAGNOSIS — M5136 Other intervertebral disc degeneration, lumbar region: Secondary | ICD-10-CM | POA: Diagnosis not present

## 2021-09-21 DIAGNOSIS — M9906 Segmental and somatic dysfunction of lower extremity: Secondary | ICD-10-CM | POA: Diagnosis not present

## 2021-09-21 DIAGNOSIS — M9902 Segmental and somatic dysfunction of thoracic region: Secondary | ICD-10-CM | POA: Diagnosis not present

## 2021-09-21 DIAGNOSIS — Z7689 Persons encountering health services in other specified circumstances: Secondary | ICD-10-CM

## 2021-09-21 DIAGNOSIS — M9905 Segmental and somatic dysfunction of pelvic region: Secondary | ICD-10-CM | POA: Diagnosis not present

## 2021-09-21 DIAGNOSIS — M2560 Stiffness of unspecified joint, not elsewhere classified: Secondary | ICD-10-CM | POA: Diagnosis not present

## 2021-09-21 DIAGNOSIS — M62830 Muscle spasm of back: Secondary | ICD-10-CM | POA: Diagnosis not present

## 2021-09-21 DIAGNOSIS — M5116 Intervertebral disc disorders with radiculopathy, lumbar region: Secondary | ICD-10-CM | POA: Diagnosis not present

## 2021-09-21 MED ORDER — WEGOVY 1 MG/0.5ML ~~LOC~~ SOAJ
1.0000 mg | SUBCUTANEOUS | 0 refills | Status: DC
Start: 2021-09-21 — End: 2021-09-21
  Filled 2021-09-21: qty 2, 28d supply, fill #0

## 2021-09-21 MED ORDER — WEGOVY 0.5 MG/0.5ML ~~LOC~~ SOAJ: 0.5000 mg | SUBCUTANEOUS | 0 refills | Status: DC

## 2021-09-22 ENCOUNTER — Telehealth: Payer: Self-pay

## 2021-09-22 ENCOUNTER — Other Ambulatory Visit: Payer: Self-pay

## 2021-09-22 ENCOUNTER — Other Ambulatory Visit (HOSPITAL_COMMUNITY): Payer: Self-pay

## 2021-09-22 DIAGNOSIS — Z7689 Persons encountering health services in other specified circumstances: Secondary | ICD-10-CM

## 2021-09-22 MED ORDER — WEGOVY 0.5 MG/0.5ML ~~LOC~~ SOAJ
0.5000 mg | SUBCUTANEOUS | 0 refills | Status: DC
  Filled 2021-09-22: qty 2, 28d supply, fill #0

## 2021-09-22 NOTE — Telephone Encounter (Signed)
PA completed waiting on insurance approval. ? ?Key: [id number] ? ?KP ?

## 2021-09-24 ENCOUNTER — Other Ambulatory Visit: Payer: Self-pay

## 2021-09-24 ENCOUNTER — Other Ambulatory Visit (HOSPITAL_COMMUNITY): Payer: Self-pay

## 2021-09-24 DIAGNOSIS — Z7689 Persons encountering health services in other specified circumstances: Secondary | ICD-10-CM

## 2021-09-24 MED ORDER — XIIDRA 5 % OP SOLN
1.0000 [drp] | Freq: Two times a day (BID) | OPHTHALMIC | 4 refills | Status: DC
  Filled 2021-09-24 – 2021-10-14 (×2): qty 180, 90d supply, fill #0

## 2021-09-24 MED ORDER — WEGOVY 0.5 MG/0.5ML ~~LOC~~ SOAJ: 0.5000 mg | SUBCUTANEOUS | 0 refills | Status: DC

## 2021-09-24 NOTE — Telephone Encounter (Signed)
Approved  KP

## 2021-09-24 NOTE — Telephone Encounter (Signed)
Pt informed and verbalized understanding.  KP 

## 2021-09-24 NOTE — Telephone Encounter (Signed)
The request has been approved. The authorization is effective for a maximum of 7 fills from 09/24/2021 to 04/22/2022, as long as the member is enrolled in their current health plan.  KP

## 2021-09-25 ENCOUNTER — Encounter: Payer: Self-pay | Admitting: Family Medicine

## 2021-09-25 ENCOUNTER — Ambulatory Visit: Payer: 59 | Admitting: Family Medicine

## 2021-09-25 VITALS — BP 120/88 | HR 71 | Ht 67.0 in | Wt 222.0 lb

## 2021-09-25 DIAGNOSIS — Z7689 Persons encountering health services in other specified circumstances: Secondary | ICD-10-CM

## 2021-09-25 DIAGNOSIS — E669 Obesity, unspecified: Secondary | ICD-10-CM

## 2021-09-25 DIAGNOSIS — G8929 Other chronic pain: Secondary | ICD-10-CM | POA: Diagnosis not present

## 2021-09-25 DIAGNOSIS — M5442 Lumbago with sciatica, left side: Secondary | ICD-10-CM

## 2021-09-25 DIAGNOSIS — E66811 Obesity, class 1: Secondary | ICD-10-CM

## 2021-09-25 MED ORDER — WEGOVY 1 MG/0.5ML ~~LOC~~ SOAJ
1.0000 mg | SUBCUTANEOUS | 0 refills | Status: DC
  Filled 2021-09-25 – 2021-10-20 (×2): qty 2, 28d supply, fill #0

## 2021-09-25 NOTE — Assessment & Plan Note (Signed)
Tolerating 0.25 mg dose well x4 weeks, 2 pounds weight loss noted, no lasting adverse effects.  Has been sent 0.5 mg weekly dose, anticipate further titration, Rx provided.

## 2021-09-25 NOTE — Assessment & Plan Note (Signed)
Acute on chronic issue noted recently after workout program, acute onset during the same with radiation into the left anterior thigh, denies any anterior hip pain, has been seeing chiropractor for this with gradual improvement, has help from regular workouts but has continued walking for exercise.  Brooke Graves states that Brooke Graves had outside x-rays through her chiropractor revealing arthritis unspecified.  Examination significant for prominent tightness throughout the bilateral hamstrings, otherwise negative FABER, FADIR, piriformis testing, and straight leg raise.  Minimally tender about the lower paraspinal region.  I advised the correlation between tight posterior chain structures and lumbosacral issues, treatment options were outlined, Brooke Graves is amenable to formal physical therapy, referral will be placed.

## 2021-09-25 NOTE — Progress Notes (Signed)
Primary Care / Sports Medicine Office Visit  Patient Information:  Patient ID: Brooke Graves, female DOB: 1975/12/11 Age: 46 y.o. MRN: [id number]   Ralphine A Higuera Cassell Clement is a pleasant 46 y.o. female presenting with the following:  Chief Complaint  Patient presents with   Weight Check    Down 2 pounds     Vitals:   09/25/21 1423  BP: 120/88  Pulse: 71  SpO2: 96%   Vitals:   09/25/21 1423  Weight: 222 lb (100.7 kg)  Height: '5\' 7"'$  (1.702 m)   Body mass index is 34.77 kg/m.  DG Lumbar Spine Complete  Result Date: 09/07/2021 CLINICAL DATA:  Back pain EXAM: PELVIS - 1-2 VIEW; LUMBAR SPINE - COMPLETE 4+ VIEW COMPARISON:  None. FINDINGS: There is no evidence of pelvic fracture or diastasis. No pelvic bone lesions are seen. There is no evidence lumbar spine fracture. 5 non-rib-bearing lumbar type vertebral bodies. Mild multilevel degenerative disc disease consisting of osteophyte formation. Disc spaces are relatively well maintained. Mild facet arthropathy of the lower lumbar spine. Calcifications of the right upper quadrant. IMPRESSION: 1. No acute osseous abnormality. 2. Right upper quadrant calcifications which may be due to nephrolithiasis or cholelithiasis. Right upper quadrant ultrasound could be performed for further evaluation. Electronically Signed   By: Yetta Glassman M.D.   On: 09/07/2021 14:06   DG Pelvis 1-2 Views  Result Date: 09/07/2021 CLINICAL DATA:  Back pain EXAM: PELVIS - 1-2 VIEW; LUMBAR SPINE - COMPLETE 4+ VIEW COMPARISON:  None. FINDINGS: There is no evidence of pelvic fracture or diastasis. No pelvic bone lesions are seen. There is no evidence lumbar spine fracture. 5 non-rib-bearing lumbar type vertebral bodies. Mild multilevel degenerative disc disease consisting of osteophyte formation. Disc spaces are relatively well maintained. Mild facet arthropathy of the lower lumbar spine. Calcifications of the right upper quadrant. IMPRESSION: 1. No acute  osseous abnormality. 2. Right upper quadrant calcifications which may be due to nephrolithiasis or cholelithiasis. Right upper quadrant ultrasound could be performed for further evaluation. Electronically Signed   By: Yetta Glassman M.D.   On: 09/07/2021 14:06     Independent interpretation of notes and tests performed by another provider:   None  Procedures performed:   None  Pertinent History, Exam, Impression, and Recommendations:   Problem List Items Addressed This Visit       Nervous and Auditory   Chronic left-sided low back pain with left-sided sciatica - Primary    Acute on chronic issue noted recently after workout program, acute onset during the same with radiation into the left anterior thigh, denies any anterior hip pain, has been seeing chiropractor for this with gradual improvement, has help from regular workouts but has continued walking for exercise.  She states that she had outside x-rays through her chiropractor revealing arthritis unspecified.  Examination significant for prominent tightness throughout the bilateral hamstrings, otherwise negative FABER, FADIR, piriformis testing, and straight leg raise.  Minimally tender about the lower paraspinal region.  I advised the correlation between tight posterior chain structures and lumbosacral issues, treatment options were outlined, she is amenable to formal physical therapy, referral will be placed.         Relevant Medications   WEGOVY 1 MG/0.5ML SOAJ   Other Relevant Orders   Ambulatory referral to Physical Therapy     Other   Severe obesity (BMI 35.0-35.9 with comorbidity) ([place])   Relevant Medications   WEGOVY 1 MG/0.5ML SOAJ   Encounter for  weight management    Tolerating 0.25 mg dose well x4 weeks, 2 pounds weight loss noted, no lasting adverse effects.  Has been sent 0.5 mg weekly dose, anticipate further titration, Rx provided.       Relevant Medications   WEGOVY 1 MG/0.5ML SOAJ     Orders &  Medications Meds ordered this encounter  Medications   WEGOVY 1 MG/0.5ML SOAJ    Sig: Inject 1 mg into the skin once a week.    Dispense:  2 mL    Refill:  0   Orders Placed This Encounter  Procedures   Ambulatory referral to Physical Therapy     Return in about 4 weeks (around 10/23/2021).     Montel Culver, MD   Primary Care Sports Medicine [place]

## 2021-09-28 ENCOUNTER — Other Ambulatory Visit (HOSPITAL_COMMUNITY): Payer: Self-pay

## 2021-09-29 ENCOUNTER — Other Ambulatory Visit (HOSPITAL_COMMUNITY): Payer: Self-pay

## 2021-10-07 ENCOUNTER — Other Ambulatory Visit (HOSPITAL_COMMUNITY): Payer: Self-pay

## 2021-10-09 ENCOUNTER — Other Ambulatory Visit (HOSPITAL_COMMUNITY): Payer: Self-pay

## 2021-10-14 ENCOUNTER — Encounter: Payer: Self-pay | Admitting: Family Medicine

## 2021-10-14 ENCOUNTER — Other Ambulatory Visit (HOSPITAL_COMMUNITY): Payer: Self-pay

## 2021-10-15 ENCOUNTER — Other Ambulatory Visit (HOSPITAL_COMMUNITY): Payer: Self-pay

## 2021-10-15 MED ORDER — SODIUM FLUORIDE 1.1 % DT PSTE
PASTE | DENTAL | 99 refills | Status: AC
  Filled 2021-10-15: qty 100, 25d supply, fill #0

## 2021-10-15 NOTE — Telephone Encounter (Signed)
I will have the PT changed, can you please advise on the letter.

## 2021-10-20 ENCOUNTER — Other Ambulatory Visit (HOSPITAL_COMMUNITY): Payer: Self-pay

## 2021-10-21 ENCOUNTER — Other Ambulatory Visit (HOSPITAL_COMMUNITY): Payer: Self-pay

## 2021-10-23 ENCOUNTER — Other Ambulatory Visit (HOSPITAL_COMMUNITY): Payer: Self-pay

## 2021-10-23 ENCOUNTER — Ambulatory Visit: Payer: 59 | Admitting: Family Medicine

## 2021-10-23 ENCOUNTER — Encounter: Payer: Self-pay | Admitting: Family Medicine

## 2021-10-23 VITALS — BP 136/82 | HR 70 | Ht 67.0 in | Wt 221.0 lb

## 2021-10-23 DIAGNOSIS — E669 Obesity, unspecified: Secondary | ICD-10-CM

## 2021-10-23 MED ORDER — WEGOVY 1.7 MG/0.75ML ~~LOC~~ SOAJ
1.7000 mg | SUBCUTANEOUS | 0 refills | Status: DC
  Filled 2021-10-23: qty 3, 28d supply, fill #0

## 2021-10-23 NOTE — Assessment & Plan Note (Signed)
Patient has been tolerating 0.5 mg weekly dose well with reports of early satiety, relative alcohol intolerance (dose dependent), but otherwise doing fine.  She demonstrates essentially stable weight, I have encouraged continued adherence to lifestyle changes, will titrate to 1 mg weekly and have the patient return in 1 month.

## 2021-10-23 NOTE — Patient Instructions (Signed)
[facility] Coach information:   Get Support From Your Coach   If you have a Careers information officer, you can connect with your Coach about similar topics you might cover with a behavioral expert. Your Coach can also help you set goals, like a nutritionist or physical activity specialist. However, keep in mind that your Coach cannot offer medical advice. You can set up a time to connect with your coach, or if you need immediate assistance, call [phone].   If you haven't yet activated coaching, you can start at any time. Just visit your Settings page.   [email or web address]   - Return in 4 weeks

## 2021-10-23 NOTE — Progress Notes (Signed)
     Primary Care / Sports Medicine Office Visit  Patient Information:  Patient ID: Brooke Graves, female DOB: Nov 09, 1975 Age: 46 y.o. MRN: [id number]   Brooke Graves is a pleasant 46 y.o. female presenting with the following:  Chief Complaint  Patient presents with   Weight Check    Tolerating medication well, just started the 1.0 today   Back Pain    Is feeling better, hasn't started PT will start soon    Vitals:   10/23/21 0844  BP: 136/82  Pulse: 70  SpO2: 98%   Vitals:   10/23/21 0844  Weight: 221 lb (100.2 kg)  Height: '5\' 7"'$  (1.702 m)   Body mass index is 34.61 kg/m.  No results found.   Independent interpretation of notes and tests performed by another provider:   None  Procedures performed:   None  Pertinent History, Exam, Impression, and Recommendations:   Problem List Items Addressed This Visit       Other   Obesity, Class I, BMI 30-34.9 - Primary    Patient has been tolerating 0.5 mg weekly dose well with reports of early satiety, relative alcohol intolerance (dose dependent), but otherwise doing fine.  She demonstrates essentially stable weight, I have encouraged continued adherence to lifestyle changes, will titrate to 1 mg weekly and have the patient return in 1 month.      Relevant Medications   WEGOVY 1.7 MG/0.75ML SOAJ     Orders & Medications Meds ordered this encounter  Medications   WEGOVY 1.7 MG/0.75ML SOAJ    Sig: Inject 1.7 mg into the skin once a week. Use this dose for 1 month (4 shots) and then increase to next higher dose.    Dispense:  3 mL    Refill:  0   No orders of the defined types were placed in this encounter.    Return in about 4 weeks (around 11/20/2021).     Montel Culver, MD   Primary Care Sports Medicine [place]

## 2021-10-28 ENCOUNTER — Encounter: Payer: Self-pay | Admitting: Family Medicine

## 2021-10-30 ENCOUNTER — Other Ambulatory Visit (HOSPITAL_COMMUNITY): Payer: Self-pay

## 2021-11-06 ENCOUNTER — Ambulatory Visit: Payer: 59 | Attending: Family Medicine

## 2021-11-06 DIAGNOSIS — M5442 Lumbago with sciatica, left side: Secondary | ICD-10-CM | POA: Diagnosis not present

## 2021-11-06 DIAGNOSIS — M6281 Muscle weakness (generalized): Secondary | ICD-10-CM | POA: Insufficient documentation

## 2021-11-06 DIAGNOSIS — G8929 Other chronic pain: Secondary | ICD-10-CM | POA: Diagnosis not present

## 2021-11-06 DIAGNOSIS — M5459 Other low back pain: Secondary | ICD-10-CM | POA: Insufficient documentation

## 2021-11-06 NOTE — Therapy (Signed)
OUTPATIENT PHYSICAL THERAPY THORACOLUMBAR EVALUATION   Patient Name: Brooke Graves MRN: [id number] DOB:[birth year], 46 y.o., female Today's Date: 11/06/2021   PT End of Session - 11/06/21 1140     Visit Number 1    Number of Visits 13    Date for PT Re-Evaluation 12/19/21    Authorization Type [facility]    PT Start Time 1100    PT Stop Time 1143    PT Time Calculation (min) 43 min    Activity Tolerance Patient tolerated treatment well    Behavior During Therapy WFL for tasks assessed/performed             Past Medical History:  Diagnosis Date   Acquired absence of both breasts 05/08/2015   Allergy    BRCA positive    BRCA2 gene mutation positive 12/26/2018   Hyperlipidemia 02/06/2021   Iron deficiency anemia 06/11/2014   Plantar fasciitis    bilateral   Poisoning, snake bite 11/2019   [place]   Psoriasis    Tick bite of ankle    right   Past Surgical History:  Procedure Laterality Date   ABDOMINAL HYSTERECTOMY  06/2014   ABDOMINOPLASTY  2006   BREAST ENHANCEMENT SURGERY Bilateral 03/2015   BREAST SURGERY Bilateral 12/18/2020   Revision   MASTECTOMY Bilateral 03/2015   ROTATOR CUFF REPAIR Right 02/2017   WISDOM TOOTH EXTRACTION Bilateral    Patient Active Problem List   Diagnosis Date Noted   Obesity, Class I, BMI 30-34.9 10/23/2021   Chronic left-sided low back pain with left-sided sciatica 09/25/2021   Encounter for weight management 08/28/2021   Acute laryngitis 05/13/2021   Sense of smell altered 03/12/2021   Routine screening for STI (sexually transmitted infection) 03/12/2021   Elevated blood-pressure reading without diagnosis of hypertension 02/06/2021   Dietary counseling and surveillance 02/06/2021   Vitamin D deficiency 02/06/2021   Annual physical exam 02/06/2021   Psoriasis    Severe obesity (BMI 35.0-35.9 with comorbidity) ([place]) 02/21/2018    PCP: Montel Culver, MD  REFERRING PROVIDER: Montel Culver, MD  REFERRING DIAG:  [phone] (ICD-10-CM) - Chronic left-sided low back pain with left-sided sciatica  Rationale for Evaluation and Treatment Rehabilitation  THERAPY DIAG:  Other low back pain  Muscle weakness (generalized)  ONSET DATE: April 2023  SUBJECTIVE:                                                                                                                                                                                           SUBJECTIVE STATEMENT: Patient started burn boot camp in January and then in April after a workout  she noticed extreme stiffness in her back. She doesn't recall the specific exercise that caused her pain, but does recall a pulling sensation at some point during her workout. She took a few days off from working out and went back without issues for a few weeks then had the same onset of pain. She saw a chiropractor after the second flare up of pain, which did help. Since going to the chiropractor she has not returned to burn boot camp, but is walking and swimming for exercise, but wants to be more active. She is not going to the chiropractor anymore. Her pain is now intermittent in the low back, but previously was also experiencing pain in the Lt anterior thigh. She reports occasional LBP in the past prior to this injury, but nothing as significant as this bout of pain.  PERTINENT HISTORY:  None   PAIN:  Are you having pain? None currently; at worst NPRS scale: 4/10 Pain location: Lt low back;SIJ Pain description: stiff;stuck Aggravating factors: bending;lifting Relieving factors: stretching   PRECAUTIONS: None  WEIGHT BEARING RESTRICTIONS No  FALLS:  Has patient fallen in last 6 months? No  LIVING ENVIRONMENT: Lives with: lives alone Lives in: House/apartment Stairs: Yes: Internal: 15 steps; on left going up Has following equipment at home: None  OCCUPATION: consultant  PLOF: Independent  PATIENT GOALS "Do stuff safely" (exercise, lifting, etc.)     OBJECTIVE:   DIAGNOSTIC FINDINGS:  Lumbar X-ray; IMPRESSION: 1. No acute osseous abnormality. 2. Right upper quadrant calcifications which may be due to nephrolithiasis or cholelithiasis. Right upper quadrant ultrasound could be performed for further evaluation.    PATIENT SURVEYS:  FOTO 71% function to 81% predicted  SCREENING FOR RED FLAGS: Bowel or bladder incontinence: No Spinal tumors: No Cauda equina syndrome: No Compression fracture: No Abdominal aneurysm: No  COGNITION:  Overall cognitive status: Within functional limits for tasks assessed     SENSATION: WFL  MUSCLE LENGTH: Hamstrings: Right lacking 40 degrees; Left lacking 40 degrees  POSTURE: rounded shoulders, forward head, and decreased lumbar lordosis  PALPATION: TTP Lt superior ilium, glute max  Lumbar PAIVM hypomobile, pain free  LUMBAR ROM:   Active  A/PROM  eval  Flexion 25% limited mild LBP  Extension 25% limited tightness  Right lateral flexion WNL  Left lateral flexion WNL  Right rotation WNL  Left rotation WNL   (Blank rows = not tested)  LOWER EXTREMITY ROM:     Active  Right eval Left eval  Hip flexion    Hip extension    Hip abduction    Hip adduction    Hip internal rotation    Hip external rotation    Knee flexion    Knee extension    Ankle dorsiflexion    Ankle plantarflexion    Ankle inversion    Ankle eversion     (Blank rows = not tested)  LOWER EXTREMITY MMT:    MMT Right eval Left eval  Hip flexion 5 5  Hip extension 4 pn! 4-pn!  Hip abduction 4+ pn! 4+  Hip adduction    Hip internal rotation    Hip external rotation    Knee flexion 5 5  Knee extension 5 5  Ankle dorsiflexion    Ankle plantarflexion    Ankle inversion    Ankle eversion    Core 3-/5   (Blank rows = not tested)  LUMBAR SPECIAL TESTS:  SLR (-)  Long Sit (-) FABER (-) Sacral thrust (-)  SI compression/distraction (-)  Trendelenburg (+) [place] (+)   FUNCTIONAL TESTS:  Squat:  limited depth, foot ER   GAIT: Distance walked: 10 ft Assistive device utilized: None Level of assistance: Complete Independence Comments: trendelenburg     TODAY'S TREATMENT  [facility] Adult PT Treatment:                                                DATE: 11/06/21 Therapeutic Exercise: Demonstrated and issue initial HEP.    Therapeutic Activity: Education on assessment findings that will be addressed throughout duration of POC.     PATIENT EDUCATION:  Education details: see treatment  Person educated: Patient Education method: Explanation, Demonstration, Tactile cues, Verbal cues, and Handouts Education comprehension: verbalized understanding, returned demonstration, verbal cues required, tactile cues required, and needs further education   HOME EXERCISE PROGRAM: Access Code: [email or web address] URL: [email or web address] Date: 11/06/2021 Prepared by: Gwendolyn Grant  Exercises - Supine Posterior Pelvic Tilt  - 1 x daily - 7 x weekly - 2 sets - 10 reps - 5 sec hold - Supine Lower Trunk Rotation  - 1 x daily - 7 x weekly - 2 sets - 10 reps - Modified Thomas Stretch  - 1 x daily - 7 x weekly - 3 sets - 30 sec hold - Seated Hamstring Stretch  - 1 x daily - 7 x weekly - 3 sets - 30 sec hold  ASSESSMENT:  CLINICAL IMPRESSION: Patient is a 46 y.o. female who was seen today for physical therapy evaluation and treatment for acute on chronic low back pain with recent flare up occurring secondary to exercise regimen. Upon assessment she is noted to have significant hamstring and quadriceps tightness, core and hip weakness, L-spine hypomobility, and mild limitation into lumbar flexion/extension AROM. She will benefit from skilled PT to address the above stated deficits in order to optimize her function and reduce the risk of re-injury to her low back.    OBJECTIVE IMPAIRMENTS Abnormal gait, decreased mobility, decreased ROM, decreased strength, hypomobility, impaired flexibility,  improper body mechanics, postural dysfunction, and pain.   ACTIVITY LIMITATIONS carrying, lifting, bending, and squatting  PARTICIPATION LIMITATIONS: community activity, yard work, and exercise routine  PERSONAL FACTORS Age, Fitness, and Time since onset of injury/illness/exacerbation are also affecting patient's functional outcome.   REHAB POTENTIAL: Excellent  CLINICAL DECISION MAKING: Stable/uncomplicated  EVALUATION COMPLEXITY: Low   GOALS: Goals reviewed with patient? No  SHORT TERM GOALS: Target date: 11/27/2021  Patient will improve hamstring flexibility by 10 degrees to reduce stress on her low back.  Baseline:see above Goal status: INITIAL  2.  Patient will demonstrate proper squat mechanics without cues.  Baseline: see above Goal status: INITIAL  3.  Patient will demonstrate pain free lumbar AROM to improve ability to complete bending and reaching activity.  Baseline: see above Goal status: INITIAL   LONG TERM GOALS: Target date: 12/18/2021  Patient will demonstrate proper form with LE CKC strengthening (squat, lunge, etc) with moderate loads without an increase in back pain.  Baseline: unable/fearful Goal status: INITIAL  2.  Patient will demonstrate 5/5 pain free hip strength to improve stability about the chain for high impact exercise.  Baseline: see above Goal status: INITIAL  3.  Patient will demonstrate ability to properly engage core musculature with standing activity to reduce stress on her low back.  Baseline: see above Goal status: INITIAL  4.  Patient will be independent with advanced home program to assist in management of her chronic condition.  Baseline: n/a Goal status: INITIAL    PLAN: PT FREQUENCY: 2x/week  PT DURATION: 6 weeks  PLANNED INTERVENTIONS: Therapeutic exercises, Therapeutic activity, Neuromuscular re-education, Balance training, Gait training, Patient/Family education, Dry Needling, Spinal manipulation, Spinal  mobilization, Cryotherapy, Moist heat, Traction, Manual therapy, and Re-evaluation.  PLAN FOR NEXT SESSION: review HEP, core strengthening, lumbar mobility, hamstring/quad stretching, body mechanics with lifting/bending  Gwendolyn Grant, PT, DPT, ATC 11/06/21 2:48 PM

## 2021-11-11 ENCOUNTER — Ambulatory Visit: Payer: 59 | Admitting: Physical Therapy

## 2021-11-20 ENCOUNTER — Encounter: Payer: Self-pay | Admitting: Physical Therapy

## 2021-11-20 ENCOUNTER — Ambulatory Visit: Payer: 59 | Attending: Family Medicine | Admitting: Physical Therapy

## 2021-11-20 ENCOUNTER — Other Ambulatory Visit: Payer: Self-pay

## 2021-11-20 DIAGNOSIS — M5459 Other low back pain: Secondary | ICD-10-CM

## 2021-11-20 DIAGNOSIS — M6281 Muscle weakness (generalized): Secondary | ICD-10-CM

## 2021-11-20 NOTE — Therapy (Signed)
OUTPATIENT PHYSICAL THERAPY TREATMENT NOTE   Patient Name: Brooke Graves MRN: [id number] DOB:06-01-1975, 46 y.o., female Today's Date: 11/20/2021  PCP: Montel Culver, MD   REFERRING PROVIDER: Montel Culver, MD   END OF SESSION:   PT End of Session - 11/20/21 0837     Visit Number 2    Number of Visits 13    Date for PT Re-Evaluation 12/19/21    Authorization Type [facility]    PT Start Time 0833    PT Stop Time 0912    PT Time Calculation (min) 39 min    Activity Tolerance Patient tolerated treatment well    Behavior During Therapy [facility] for tasks assessed/performed             Past Medical History:  Diagnosis Date   Acquired absence of both breasts 05/08/2015   Allergy    BRCA positive    BRCA2 gene mutation positive 12/26/2018   Hyperlipidemia 02/06/2021   Iron deficiency anemia 06/11/2014   Plantar fasciitis    bilateral   Poisoning, snake bite 11/2019   [place]   Psoriasis    Tick bite of ankle    right   Past Surgical History:  Procedure Laterality Date   ABDOMINAL HYSTERECTOMY  06/2014   ABDOMINOPLASTY  2006   BREAST ENHANCEMENT SURGERY Bilateral 03/2015   BREAST SURGERY Bilateral 12/18/2020   Revision   MASTECTOMY Bilateral 03/2015   ROTATOR CUFF REPAIR Right 02/2017   WISDOM TOOTH EXTRACTION Bilateral    Patient Active Problem List   Diagnosis Date Noted   Obesity, Class I, BMI 30-34.9 10/23/2021   Chronic left-sided low back pain with left-sided sciatica 09/25/2021   Encounter for weight management 08/28/2021   Acute laryngitis 05/13/2021   Sense of smell altered 03/12/2021   Routine screening for STI (sexually transmitted infection) 03/12/2021   Elevated blood-pressure reading without diagnosis of hypertension 02/06/2021   Dietary counseling and surveillance 02/06/2021   Vitamin D deficiency 02/06/2021   Annual physical exam 02/06/2021   Psoriasis    Severe obesity (BMI 35.0-35.9 with comorbidity) ([place]) 02/21/2018     REFERRING DIAG: Chronic left-sided low back pain with left-sided sciatica  THERAPY DIAG:  Other low back pain  Muscle weakness (generalized)  Rationale for Evaluation and Treatment Rehabilitation  PERTINENT HISTORY: None   PRECAUTIONS: None   SUBJECTIVE: Patient reports 4 days after the evaluation she fell down the stairs so was having more pain. Currently she is feeling better but states she gets pain in the upper left buttock with sitting for extended periods. After the fall she was having the pain in front of the thigh again but this has since resolved.  PAIN:  Are you having pain?  None currently; at worst NPRS scale: 3/10 discomfort Pain location: Left low back; SIJ Pain description: Stiff; stuck Aggravating factors: Bending; lifting Relieving factors: Stretching  PATIENT GOALS: "Do stuff safely" (exercise, lifting, etc.)    OBJECTIVE: (objective measures completed at initial evaluation unless otherwise dated) PATIENT SURVEYS:  FOTO 71% function to 81% predicted   MUSCLE LENGTH: Hamstrings: Right lacking 40 degrees; Left lacking 40 degrees   POSTURE:  Rounded shoulders, forward head, and decreased lumbar lordosis   PALPATION: TTP Lt superior ilium, glute max  Lumbar PAIVM hypomobile, pain free   LUMBAR ROM:    Active  A/PROM  eval  Flexion 25% limited mild LBP  Extension 25% limited tightness  Right lateral flexion WNL  Left lateral flexion WNL  Right  rotation WNL  Left rotation WNL   LOWER EXTREMITY ROM:      Active  Right eval Left eval  Hip flexion      Hip extension      Hip abduction      Hip adduction      Hip internal rotation      Hip external rotation      Knee flexion      Knee extension      Ankle dorsiflexion      Ankle plantarflexion      Ankle inversion      Ankle eversion       LOWER EXTREMITY MMT:     MMT Right eval Left eval  Hip flexion 5 5  Hip extension 4 pn! 4-pn!  Hip abduction 4+ pn! 4+  Hip adduction       Hip internal rotation      Hip external rotation      Knee flexion 5 5  Knee extension 5 5  Ankle dorsiflexion      Ankle plantarflexion      Ankle inversion      Ankle eversion      Core 3-/5   LUMBAR SPECIAL TESTS:  SLR (-)  Long Sit (-) FABER (-) Sacral thrust (-)  SI compression/distraction (-)  Trendelenburg (+) [place] (+)    FUNCTIONAL TESTS:  Squat: limited depth, foot ER    GAIT: Distance walked: 10 ft Assistive device utilized: None Level of assistance: Complete Independence Comments: Trendelenburg      TODAY'S TREATMENT  [facility] Adult PT Treatment:                                                DATE: 11/20/2021 Therapeutic Exercise: Recumbent bike L3 x 5 min while taking subjective 1/2 kneeling hip flexor/quad stretch 5 x 10 sec each Supine pelvic tilt 10 x 5 sec Bridge 2 x 10 - focus on PPT and lifting one vertebra at a time to avoid lumbar extension 90-90 table top hold 10 x 5 sec lifting one leg at a time Clamshell with red 2 x 15 each Modified front plank on knees 2 x 3 with 15 sec holds    PATIENT EDUCATION:  Education details: HEP update Person educated: Patient Education method: Explanation, Demonstration, Tactile cues, Verbal cues, and Handouts Education comprehension: verbalized understanding, returned demonstration, verbal cues required, tactile cues required, and needs further education   HOME EXERCISE PROGRAM: Access Code: [email or web address]    ASSESSMENT: CLINICAL IMPRESSION: Patient tolerated therapy well with no adverse effcts. Therapy focused on continued stretching for the hips and progression of core and hip strengthening with good tolerance. She does require consistent cue for proper control with core activation and to avoid lumbar extension. She did not report any increase in pain with therapy. Updated HEP and patient would benefit from continued skilled PT to progress her mobility and strength in order to reduce pain and activity and maximizing her  functional ability.     OBJECTIVE IMPAIRMENTS Abnormal gait, decreased mobility, decreased ROM, decreased strength, hypomobility, impaired flexibility, improper body mechanics, postural dysfunction, and pain.    ACTIVITY LIMITATIONS carrying, lifting, bending, and squatting   PARTICIPATION LIMITATIONS: community activity, yard work, and exercise routine   PERSONAL FACTORS Age, Fitness, and Time since onset of injury/illness/exacerbation are also affecting patient's functional outcome.  GOALS: SHORT TERM GOALS: Target date: 11/27/2021   Patient will improve hamstring flexibility by 10 degrees to reduce stress on her low back.  Baseline:see above Goal status: INITIAL   2.  Patient will demonstrate proper squat mechanics without cues.  Baseline: see above Goal status: INITIAL   3.  Patient will demonstrate pain free lumbar AROM to improve ability to complete bending and reaching activity.  Baseline: see above Goal status: INITIAL    LONG TERM GOALS: Target date: 12/18/2021   Patient will demonstrate proper form with LE CKC strengthening (squat, lunge, etc) with moderate loads without an increase in back pain.  Baseline: unable/fearful Goal status: INITIAL   2.  Patient will demonstrate 5/5 pain free hip strength to improve stability about the chain for high impact exercise.  Baseline: see above Goal status: INITIAL   3.  Patient will demonstrate ability to properly engage core musculature with standing activity to reduce stress on her low back.  Baseline: see above Goal status: INITIAL   4.  Patient will be independent with advanced home program to assist in management of her chronic condition.  Baseline: n/a Goal status: INITIAL    PLAN: PT FREQUENCY: 2x/week   PT DURATION: 6 weeks   PLANNED INTERVENTIONS: Therapeutic exercises, Therapeutic activity, Neuromuscular re-education, Balance training, Gait training, Patient/Family education, Dry Needling, Spinal  manipulation, Spinal mobilization, Cryotherapy, Moist heat, Traction, Manual therapy, and Re-evaluation.   PLAN FOR NEXT SESSION: review HEP, core strengthening, lumbar mobility, hamstring/quad stretching, body mechanics with lifting/bending    Hilda Blades, PT, DPT, LAT, ATC 11/20/21  9:14 AM Phone: [phone] Fax: [phone]

## 2021-11-23 ENCOUNTER — Ambulatory Visit: Payer: 59 | Admitting: Physical Therapy

## 2021-11-23 ENCOUNTER — Encounter: Payer: 59 | Admitting: Physical Therapy

## 2021-11-25 ENCOUNTER — Ambulatory Visit: Payer: 59

## 2021-11-25 DIAGNOSIS — M6281 Muscle weakness (generalized): Secondary | ICD-10-CM

## 2021-11-25 DIAGNOSIS — M5459 Other low back pain: Secondary | ICD-10-CM

## 2021-11-25 NOTE — Therapy (Signed)
OUTPATIENT PHYSICAL THERAPY TREATMENT NOTE   Patient Name: Brooke Graves MRN: [id number] DOB:[birth year], 46 y.o., female Today's Date: 11/25/2021  PCP: Montel Culver, MD   REFERRING PROVIDER: Montel Culver, MD   END OF SESSION:   PT End of Session - 11/25/21 0837     Visit Number 3    Number of Visits 13    Date for PT Re-Evaluation 12/19/21    Authorization Type [facility]    Authorization Time Period FOTO v6, v10    PT Start Time 0837   Pt arrived 7 minutes late   PT Stop Time 0915    PT Time Calculation (min) 38 min    Activity Tolerance Patient tolerated treatment well    Behavior During Therapy [facility] for tasks assessed/performed              Past Medical History:  Diagnosis Date   Acquired absence of both breasts 05/08/2015   Allergy    BRCA positive    BRCA2 gene mutation positive 12/26/2018   Hyperlipidemia 02/06/2021   Iron deficiency anemia 06/11/2014   Plantar fasciitis    bilateral   Poisoning, snake bite 11/2019   [place]   Psoriasis    Tick bite of ankle    right   Past Surgical History:  Procedure Laterality Date   ABDOMINAL HYSTERECTOMY  06/2014   ABDOMINOPLASTY  2006   BREAST ENHANCEMENT SURGERY Bilateral 03/2015   BREAST SURGERY Bilateral 12/18/2020   Revision   MASTECTOMY Bilateral 03/2015   ROTATOR CUFF REPAIR Right 02/2017   WISDOM TOOTH EXTRACTION Bilateral    Patient Active Problem List   Diagnosis Date Noted   Obesity, Class I, BMI 30-34.9 10/23/2021   Chronic left-sided low back pain with left-sided sciatica 09/25/2021   Encounter for weight management 08/28/2021   Acute laryngitis 05/13/2021   Sense of smell altered 03/12/2021   Routine screening for STI (sexually transmitted infection) 03/12/2021   Elevated blood-pressure reading without diagnosis of hypertension 02/06/2021   Dietary counseling and surveillance 02/06/2021   Vitamin D deficiency 02/06/2021   Annual physical exam 02/06/2021   Psoriasis     Severe obesity (BMI 35.0-35.9 with comorbidity) ([place]) 02/21/2018    REFERRING DIAG: Chronic left-sided low back pain with left-sided sciatica  THERAPY DIAG:  Other low back pain  Muscle weakness (generalized)  Rationale for Evaluation and Treatment Rehabilitation  PERTINENT HISTORY: None   PRECAUTIONS: None   SUBJECTIVE: Pt reports feeling well today, reporting no LBP today. She reports adherence to her HEP.   PAIN:  Are you having pain?  None currently; at worst NPRS scale: 0/10 discomfort Pain location: Left low back; SIJ Pain description: Stiff; stuck Aggravating factors: Bending; lifting Relieving factors: Stretching  PATIENT GOALS: "Do stuff safely" (exercise, lifting, etc.)    OBJECTIVE: (objective measures completed at initial evaluation unless otherwise dated) PATIENT SURVEYS:  FOTO 71% function to 81% predicted   MUSCLE LENGTH: Hamstrings: Right lacking 40 degrees; Left lacking 40 degrees   POSTURE:  Rounded shoulders, forward head, and decreased lumbar lordosis   PALPATION: TTP Lt superior ilium, glute max  Lumbar PAIVM hypomobile, pain free   LUMBAR ROM:    Active  A/PROM  eval  Flexion 25% limited mild LBP  Extension 25% limited tightness  Right lateral flexion WNL  Left lateral flexion WNL  Right rotation WNL  Left rotation WNL   LOWER EXTREMITY ROM:      Active  Right eval Left eval  Hip  flexion      Hip extension      Hip abduction      Hip adduction      Hip internal rotation      Hip external rotation      Knee flexion      Knee extension      Ankle dorsiflexion      Ankle plantarflexion      Ankle inversion      Ankle eversion       LOWER EXTREMITY MMT:     MMT Right eval Left eval  Hip flexion 5 5  Hip extension 4 pn! 4-pn!  Hip abduction 4+ pn! 4+  Hip adduction      Hip internal rotation      Hip external rotation      Knee flexion 5 5  Knee extension 5 5  Ankle dorsiflexion      Ankle plantarflexion       Ankle inversion      Ankle eversion      Core 3-/5   LUMBAR SPECIAL TESTS:  SLR (-)  Long Sit (-) FABER (-) Sacral thrust (-)  SI compression/distraction (-)  Trendelenburg (+) [place] (+)   11/25/2021: Ober's (+) on Lt   FUNCTIONAL TESTS:  Squat: limited depth, foot ER    GAIT: Distance walked: 10 ft Assistive device utilized: None Level of assistance: Complete Independence Comments: Trendelenburg      TODAY'S TREATMENT   [facility] Adult PT Treatment:                                                DATE: 11/25/2021 Therapeutic Exercise: Knee plank 3x27min Prone press-up 3x30sec Side knee plank with clamshells 2x10 BIL Long-arm reverse crunch with 2# ball in hands 3x8 Standing IT band stretch with overhead reach x86min BIL [place] deadlift with 45# bar 3x8 [place] streches 3x10 Standing Cybex hip abduction with 25# 2x10 BIL Manual Therapy: N/A Neuromuscular re-ed: N/A Therapeutic Activity: N/A Modalities: N/A Self Care: N/A  [facility] Adult PT Treatment:                                                DATE: 11/20/2021 Therapeutic Exercise: Recumbent bike L3 x 5 min while taking subjective 1/2 kneeling hip flexor/quad stretch 5 x 10 sec each Supine pelvic tilt 10 x 5 sec Bridge 2 x 10 - focus on PPT and lifting one vertebra at a time to avoid lumbar extension 90-90 table top hold 10 x 5 sec lifting one leg at a time Clamshell with red 2 x 15 each Modified front plank on knees 2 x 3 with 15 sec holds    PATIENT EDUCATION:  Education details: HEP update Person educated: Patient Education method: Explanation, Demonstration, Tactile cues, Verbal cues, and Handouts Education comprehension: verbalized understanding, returned demonstration, verbal cues required, tactile cues required, and needs further education   HOME EXERCISE PROGRAM: Access Code: [email or web address] URL: [email or web address] Date: 11/25/2021 Prepared by: Vanessa [place]  Exercises - Supine  Posterior Pelvic Tilt  - 1 x daily - 7 x weekly - 2 sets - 10 reps - 5 sec hold - Supine Lower Trunk Rotation  - 1 x daily - 7 x weekly - 2  sets - 10 reps - Supine Bridge with Gluteal Set and Spinal Articulation  - 1 x daily - 7 x weekly - 2 sets - 10 reps - Clam with Resistance  - 1 x daily - 2 sets - 15 reps - Modified Thomas Stretch  - 1 x daily - 7 x weekly - 3 sets - 30 sec hold - Seated Hamstring Stretch  - 1 x daily - 7 x weekly - 3 sets - 30 sec hold - Half Kneeling Hip Flexor Stretch  - 1 x daily - 7 x weekly - 5 sets - 10 seconds hold   Added 11/25/2021: - Standing ITB Stretch  - 1 x daily - 7 x weekly - 2 sets - 1-min hold - Plank on Knees  - 1 x daily - 7 x weekly - 3 sets - 60-seconds hold    ASSESSMENT: CLINICAL IMPRESSION: Pt responded well to progressed core/ hip strengthening today, demonstrating good form and no pain throughout the session. She reports Lt lateral hip popping during reverse crunch variation and with walking. Pt demonstrates positive Ober's on the Lt, as well as TTP to greater trochanter. Ruling up ITBS due to these factors. Pt reports a therapeutic response to IT band stretching, and this was added to her HEP.     OBJECTIVE IMPAIRMENTS Abnormal gait, decreased mobility, decreased ROM, decreased strength, hypomobility, impaired flexibility, improper body mechanics, postural dysfunction, and pain.    ACTIVITY LIMITATIONS carrying, lifting, bending, and squatting   PARTICIPATION LIMITATIONS: community activity, yard work, and exercise routine   PERSONAL FACTORS Age, Fitness, and Time since onset of injury/illness/exacerbation are also affecting patient's functional outcome.      GOALS: SHORT TERM GOALS: Target date: 11/27/2021   Patient will improve hamstring flexibility by 10 degrees to reduce stress on her low back.  Baseline:see above Goal status: INITIAL   2.  Patient will demonstrate proper squat mechanics without cues.  Baseline: see above Goal  status: INITIAL   3.  Patient will demonstrate pain free lumbar AROM to improve ability to complete bending and reaching activity.  Baseline: see above Goal status: INITIAL    LONG TERM GOALS: Target date: 12/18/2021   Patient will demonstrate proper form with LE CKC strengthening (squat, lunge, etc) with moderate loads without an increase in back pain.  Baseline: unable/fearful Goal status: INITIAL   2.  Patient will demonstrate 5/5 pain free hip strength to improve stability about the chain for high impact exercise.  Baseline: see above Goal status: INITIAL   3.  Patient will demonstrate ability to properly engage core musculature with standing activity to reduce stress on her low back.  Baseline: see above Goal status: INITIAL   4.  Patient will be independent with advanced home program to assist in management of her chronic condition.  Baseline: n/a Goal status: INITIAL    PLAN: PT FREQUENCY: 2x/week   PT DURATION: 6 weeks   PLANNED INTERVENTIONS: Therapeutic exercises, Therapeutic activity, Neuromuscular re-education, Balance training, Gait training, Patient/Family education, Dry Needling, Spinal manipulation, Spinal mobilization, Cryotherapy, Moist heat, Traction, Manual therapy, and Re-evaluation.   PLAN FOR NEXT SESSION: review HEP, core strengthening, lumbar mobility, hamstring/quad stretching, body mechanics with lifting/bending    Vanessa [place], PT, DPT 11/25/21 9:15 AM

## 2021-11-26 ENCOUNTER — Encounter: Payer: Self-pay | Admitting: Family Medicine

## 2021-11-26 ENCOUNTER — Ambulatory Visit: Payer: 59 | Admitting: Family Medicine

## 2021-11-26 ENCOUNTER — Other Ambulatory Visit (HOSPITAL_COMMUNITY): Payer: Self-pay

## 2021-11-26 VITALS — BP 118/88 | HR 78 | Ht 67.0 in | Wt 213.0 lb

## 2021-11-26 DIAGNOSIS — G8929 Other chronic pain: Secondary | ICD-10-CM

## 2021-11-26 DIAGNOSIS — M67469 Ganglion, unspecified knee: Secondary | ICD-10-CM | POA: Insufficient documentation

## 2021-11-26 DIAGNOSIS — M5442 Lumbago with sciatica, left side: Secondary | ICD-10-CM

## 2021-11-26 DIAGNOSIS — E669 Obesity, unspecified: Secondary | ICD-10-CM

## 2021-11-26 MED ORDER — WEGOVY 2.4 MG/0.75ML ~~LOC~~ SOAJ
2.4000 mg | SUBCUTANEOUS | 0 refills | Status: DC
  Filled 2021-11-26: qty 3, 28d supply, fill #0

## 2021-11-26 NOTE — Patient Instructions (Signed)
-   Finish out remaining [employer] 1.7 mg weekly dose until you have completely total of 4 doses - Then transition to [facility] 2.4 mg weekly dose x4 doses - If side effects are not tolerable, contact us to discuss next steps (medications versus decreasing dose) - Continue with physical therapy for the back - Incorporate resistance exercises to maintain muscle - Return for annual physical in 2 months

## 2021-11-26 NOTE — Assessment & Plan Note (Signed)
Patient has started physical therapy and has been compliant with home exercises, I have encouraged her to continue these and include resistance training.  She has noted improvement in low back from concomitant weight loss.

## 2021-11-26 NOTE — Assessment & Plan Note (Signed)
Patient did note issues transitioning from 0.5 mg Wegovy to 1 mg Wegovy with significant nausea and emesis, noted this again on repeat dosing, has since subsided.  She has noted a strong link to alcohol intake and symptomatology.  She has since increased to 1.7 mg and only had mild transient nausea but otherwise tolerating it very well.  She has been noted to have lost roughly 10 pounds of weight.  I did discuss possibility of asymmetric fat and muscle loss, no confirmatory data available at this time, I have encouraged her to maintain strength training.  We will go ahead and further titrate to 2.4 mg weekly for her to initiate after 4 doses of the 1.7 mg.  She will return for follow-up in 2 months for reevaluation.

## 2021-11-26 NOTE — Progress Notes (Signed)
Primary Care / Sports Medicine Office Visit  Patient Information:  Patient ID: Brooke Graves, female DOB: Mar 08, 1976 Age: 46 y.o. MRN: [id number]   Mitchell is a pleasant 46 y.o. female presenting with the following:  Chief Complaint  Patient presents with   Weight Check    Had issue moving from .5 to 1, is on the 1.7 for 1 week and is tolerating well.     Vitals:   11/26/21 0849  BP: 118/88  Pulse: 78  SpO2: 98%   Vitals:   11/26/21 0849  Weight: 213 lb (96.6 kg)  Height: '5\' 7"'$  (1.702 m)   Body mass index is 33.36 kg/m.  No results found.   Independent interpretation of notes and tests performed by another provider:   None  Procedures performed:   None  Pertinent History, Exam, Impression, and Recommendations:   Problem List Items Addressed This Visit       Nervous and Auditory   Chronic left-sided low back pain with left-sided sciatica    Patient has started physical therapy and has been compliant with home exercises, I have encouraged her to continue these and include resistance training.  She has noted improvement in low back from concomitant weight loss.      Relevant Medications   WEGOVY 2.4 MG/0.75ML SOAJ     Other   Obesity, Class I, BMI 30-34.9 - Primary    Patient did note issues transitioning from 0.5 mg Wegovy to 1 mg Wegovy with significant nausea and emesis, noted this again on repeat dosing, has since subsided.  She has noted a strong link to alcohol intake and symptomatology.  She has since increased to 1.7 mg and only had mild transient nausea but otherwise tolerating it very well.  She has been noted to have lost roughly 10 pounds of weight.  I did discuss possibility of asymmetric fat and muscle loss, no confirmatory data available at this time, I have encouraged her to maintain strength training.  We will go ahead and further titrate to 2.4 mg weekly for her to initiate after 4 doses of the 1.7 mg.  She will  return for follow-up in 2 months for reevaluation.      Relevant Medications   WEGOVY 2.4 MG/0.75ML SOAJ   Ganglion of lower leg    Patient with atraumatic history of chronic right shin focal swelling that is only noticeable by palpation, location is the distal aspect of the upper third anterior tibia/proximal aspect of the middle third of the anterior tibia, she states that this fluctuates in size based on activity, has not noted any overlying skin changes including color or texture, no radiation of symptoms, no significant discomfort unless deep palpation.  Evaluation reveals no abnormalities to inspection, deep palpation reveals a somewhat mobile spherical firm nodularity most consistent with a ganglion.  I discussed the nature of this condition and treatment options, she has elected to monitor this for now and I have encouraged her to contact us for any change in symptoms.        Orders & Medications Meds ordered this encounter  Medications   WEGOVY 2.4 MG/0.75ML SOAJ    Sig: Inject 2.4 mg into the skin once a week.    Dispense:  3 mL    Refill:  0   No orders of the defined types were placed in this encounter.    Return in about 2 months (around 02/08/2022) for Annual Physical.  Montel Culver, MD   Primary Care Sports Medicine [place]

## 2021-11-26 NOTE — Assessment & Plan Note (Signed)
Patient with atraumatic history of chronic right shin focal swelling that is only noticeable by palpation, location is the distal aspect of the upper third anterior tibia/proximal aspect of the middle third of the anterior tibia, she states that this fluctuates in size based on activity, has not noted any overlying skin changes including color or texture, no radiation of symptoms, no significant discomfort unless deep palpation.  Evaluation reveals no abnormalities to inspection, deep palpation reveals a somewhat mobile spherical firm nodularity most consistent with a ganglion.  I discussed the nature of this condition and treatment options, she has elected to monitor this for now and I have encouraged her to contact us for any change in symptoms.

## 2021-11-27 ENCOUNTER — Ambulatory Visit: Payer: 59

## 2021-11-27 ENCOUNTER — Ambulatory Visit: Payer: 59 | Admitting: Family Medicine

## 2021-11-27 DIAGNOSIS — M5459 Other low back pain: Secondary | ICD-10-CM

## 2021-11-27 DIAGNOSIS — M6281 Muscle weakness (generalized): Secondary | ICD-10-CM | POA: Diagnosis not present

## 2021-11-27 NOTE — Therapy (Signed)
OUTPATIENT PHYSICAL THERAPY TREATMENT NOTE   Patient Name: Brooke Graves MRN: [id number] DOB:Oct 10, 1975, 46 y.o., female Today's Date: 11/27/2021  PCP: Montel Culver, MD   REFERRING PROVIDER: Montel Culver, MD   END OF SESSION:   PT End of Session - 11/27/21 0940     Visit Number 4    Number of Visits 13    Date for PT Re-Evaluation 12/19/21    Authorization Type [facility]    Authorization Time Period FOTO v6, v10    PT Start Time 0940   patient late   PT Stop Time 1020    PT Time Calculation (min) 40 min    Activity Tolerance Patient tolerated treatment well    Behavior During Therapy WFL for tasks assessed/performed               Past Medical History:  Diagnosis Date   Acquired absence of both breasts 05/08/2015   Allergy    BRCA positive    BRCA2 gene mutation positive 12/26/2018   Hyperlipidemia 02/06/2021   Iron deficiency anemia 06/11/2014   Plantar fasciitis    bilateral   Poisoning, snake bite 11/2019   [place]   Psoriasis    Tick bite of ankle    right   Past Surgical History:  Procedure Laterality Date   ABDOMINAL HYSTERECTOMY  06/2014   ABDOMINOPLASTY  2006   BREAST ENHANCEMENT SURGERY Bilateral 03/2015   BREAST SURGERY Bilateral 12/18/2020   Revision   MASTECTOMY Bilateral 03/2015   ROTATOR CUFF REPAIR Right 02/2017   WISDOM TOOTH EXTRACTION Bilateral    Patient Active Problem List   Diagnosis Date Noted   Ganglion of lower leg 11/26/2021   Obesity, Class I, BMI 30-34.9 10/23/2021   Chronic left-sided low back pain with left-sided sciatica 09/25/2021   Encounter for weight management 08/28/2021   Acute laryngitis 05/13/2021   Sense of smell altered 03/12/2021   Routine screening for STI (sexually transmitted infection) 03/12/2021   Elevated blood-pressure reading without diagnosis of hypertension 02/06/2021   Dietary counseling and surveillance 02/06/2021   Vitamin D deficiency 02/06/2021   Annual physical exam  02/06/2021   Psoriasis    Severe obesity (BMI 35.0-35.9 with comorbidity) ([place]) 02/21/2018    REFERRING DIAG: Chronic left-sided low back pain with left-sided sciatica  THERAPY DIAG:  Other low back pain  Muscle weakness (generalized)  Rationale for Evaluation and Treatment Rehabilitation  PERTINENT HISTORY: None   PRECAUTIONS: None   SUBJECTIVE:  Patient reports her back is feeling good, but her abs are sore.  PAIN:  Are you having pain?  None currently; at worst NPRS scale: 5/10  Pain location: Left low back; SIJ Pain description: Stiff; stuck Aggravating factors: prolonged sitting Relieving factors: Stretching  PATIENT GOALS: "Do stuff safely" (exercise, lifting, etc.)    OBJECTIVE: (objective measures completed at initial evaluation unless otherwise dated) PATIENT SURVEYS:  FOTO 71% function to 81% predicted   MUSCLE LENGTH: Hamstrings: Right lacking 40 degrees; Left lacking 40 degrees 11/27/21: left lacking 38 degrees; right lacking 40 degrees    POSTURE:  Rounded shoulders, forward head, and decreased lumbar lordosis   PALPATION: TTP Lt superior ilium, glute max  Lumbar PAIVM hypomobile, pain free   LUMBAR ROM:    Active  A/PROM  eval 11/27/21  Flexion 25% limited mild LBP 25% limited mild LBP  Extension 25% limited tightness WNL  Right lateral flexion WNL   Left lateral flexion WNL   Right rotation WNL  Left rotation WNL    LOWER EXTREMITY ROM:      Active  Right eval Left eval  Hip flexion      Hip extension      Hip abduction      Hip adduction      Hip internal rotation      Hip external rotation      Knee flexion      Knee extension      Ankle dorsiflexion      Ankle plantarflexion      Ankle inversion      Ankle eversion       LOWER EXTREMITY MMT:     MMT Right eval Left eval  Hip flexion 5 5  Hip extension 4 pn! 4-pn!  Hip abduction 4+ pn! 4+  Hip adduction      Hip internal rotation      Hip external rotation      Knee  flexion 5 5  Knee extension 5 5  Ankle dorsiflexion      Ankle plantarflexion      Ankle inversion      Ankle eversion      Core 3-/5   LUMBAR SPECIAL TESTS:  SLR (-)  Long Sit (-) FABER (-) Sacral thrust (-)  SI compression/distraction (-)  Trendelenburg (+) [place] (+)   11/25/2021: Ober's (+) on Lt   FUNCTIONAL TESTS:  Squat: limited depth, foot ER    GAIT: Distance walked: 10 ft Assistive device utilized: None Level of assistance: Complete Independence Comments: Trendelenburg      TODAY'S TREATMENT  [facility] Adult PT Treatment:                                                DATE: 11/27/21 Therapeutic Exercise: Elliptical x 5 minutes level 4  Seated figure 4 x 30 sec each Seated pelvic tilts 1 x 10  Supine TA Mar 03, 2024x 10  Dead bug LE only 2 x 10  Hip bridge with single leg knee extension 2 x 10 Cat cow 1 x 10   Quadruped knee extension 2 x 10  Lateral band walks 4 x 15 ft; blue band at shins  Updated HEP [facility] Adult PT Treatment:                                                DATE: 11/25/2021 Therapeutic Exercise: Knee plank 3x46min Prone press-up 3x30sec Side knee plank with clamshells 2x10 BIL Long-arm reverse crunch with 2# ball in hands 3x8 Standing IT band stretch with overhead reach x84min BIL [place] deadlift with 45# bar 3x8 Windmill streches 3x10 Standing Cybex hip abduction with 25# 2x10 BIL Manual Therapy: N/A Neuromuscular re-ed: N/A Therapeutic Activity: N/A Modalities: N/A Self Care: N/A  [facility] Adult PT Treatment:                                                DATE: 11/20/2021 Therapeutic Exercise: Recumbent bike L3 x 5 min while taking subjective 1/2 kneeling hip flexor/quad stretch 5 x 10 sec each Supine pelvic tilt 10 x 5 sec  Bridge 2 x 10 - focus on PPT and lifting one vertebra at a time to avoid lumbar extension 90-90 table top hold 10 x 5 sec lifting one leg at a time Clamshell with red 2 x 15 each Modified front plank on knees 2 x 3  with 15 sec holds    PATIENT EDUCATION:  Education details: HEP update Person educated: Patient Education method: Explanation, Demonstration, Tactile cues, Verbal cues, and Handouts Education comprehension: verbalized understanding, returned demonstration, verbal cues required, tactile cues required, and needs further education   HOME EXERCISE PROGRAM: Access Code: [email or web address] URL: [email or web address] Date: 11/25/2021 Prepared by: Vanessa Byars  Exercises - Supine Posterior Pelvic Tilt  - 1 x daily - 7 x weekly - 2 sets - 10 reps - 5 sec hold - Supine Lower Trunk Rotation  - 1 x daily - 7 x weekly - 2 sets - 10 reps - Supine Bridge with Gluteal Set and Spinal Articulation  - 1 x daily - 7 x weekly - 2 sets - 10 reps - Clam with Resistance  - 1 x daily - 2 sets - 15 reps - Modified Thomas Stretch  - 1 x daily - 7 x weekly - 3 sets - 30 sec hold - Seated Hamstring Stretch  - 1 x daily - 7 x weekly - 3 sets - 30 sec hold - Half Kneeling Hip Flexor Stretch  - 1 x daily - 7 x weekly - 5 sets - 10 seconds hold   Added 11/25/2021: - Standing ITB Stretch  - 1 x daily - 7 x weekly - 2 sets - 1-min hold - Plank on Knees  - 1 x daily - 7 x weekly - 3 sets - 60-seconds hold    ASSESSMENT: CLINICAL IMPRESSION: Patient tolerated session well today focusing on progression of dynamic core stabilization and hip strengthening. She reports noticing the most discomfort in her back when she goes to stand after sitting for prolonged period, so HEP was updated to include sitting stretches and mobility exercises to perform prior to standing up. She demonstrates excellent lumbopelvic control with supine dynamic core stabilization, but has difficulty maintaining level pelvis with quadruped strengthening. Hamstring flexibility remains unchanged compared to initial evaluation and she continues to report LBP with trunk flexion AROM.      OBJECTIVE IMPAIRMENTS Abnormal gait, decreased mobility,  decreased ROM, decreased strength, hypomobility, impaired flexibility, improper body mechanics, postural dysfunction, and pain.    ACTIVITY LIMITATIONS carrying, lifting, bending, and squatting   PARTICIPATION LIMITATIONS: community activity, yard work, and exercise routine   PERSONAL FACTORS Age, Fitness, and Time since onset of injury/illness/exacerbation are also affecting patient's functional outcome.      GOALS: SHORT TERM GOALS: Target date: 11/27/2021   Patient will improve hamstring flexibility by 10 degrees to reduce stress on her low back.  Baseline:see above Goal status: ongoing    2.  Patient will demonstrate proper squat mechanics without cues.  Baseline: see above Goal status: INITIAL   3.  Patient will demonstrate pain free lumbar AROM to improve ability to complete bending and reaching activity.  Baseline: see above Goal status: ongoing    LONG TERM GOALS: Target date: 12/18/2021   Patient will demonstrate proper form with LE CKC strengthening (squat, lunge, etc) with moderate loads without an increase in back pain.  Baseline: unable/fearful Goal status: INITIAL   2.  Patient will demonstrate 5/5 pain free hip strength to improve stability about the chain for high impact exercise.  Baseline:  see above Goal status: INITIAL   3.  Patient will demonstrate ability to properly engage core musculature with standing activity to reduce stress on her low back.  Baseline: see above Goal status: INITIAL   4.  Patient will be independent with advanced home program to assist in management of her chronic condition.  Baseline: n/a Goal status: INITIAL    PLAN: PT FREQUENCY: 2x/week   PT DURATION: 6 weeks   PLANNED INTERVENTIONS: Therapeutic exercises, Therapeutic activity, Neuromuscular re-education, Balance training, Gait training, Patient/Family education, Dry Needling, Spinal manipulation, Spinal mobilization, Cryotherapy, Moist heat, Traction, Manual therapy, and  Re-evaluation.   PLAN FOR NEXT SESSION: review HEP, core strengthening, lumbar mobility, hamstring/quad stretching, body mechanics with lifting/bending Gwendolyn Grant, PT, DPT, ATC 11/27/21 10:22 AM

## 2021-11-30 ENCOUNTER — Encounter: Payer: 59 | Admitting: Physical Therapy

## 2021-12-01 ENCOUNTER — Encounter: Payer: 59 | Admitting: Physical Therapy

## 2021-12-04 ENCOUNTER — Ambulatory Visit: Payer: 59

## 2021-12-04 ENCOUNTER — Other Ambulatory Visit (HOSPITAL_COMMUNITY): Payer: Self-pay

## 2021-12-04 DIAGNOSIS — M5459 Other low back pain: Secondary | ICD-10-CM

## 2021-12-04 DIAGNOSIS — M6281 Muscle weakness (generalized): Secondary | ICD-10-CM | POA: Diagnosis not present

## 2021-12-04 NOTE — Therapy (Signed)
OUTPATIENT PHYSICAL THERAPY TREATMENT NOTE   Patient Name: Brooke Graves MRN: [id number] DOB:December 19, 1975, 46 y.o., female Today's Date: 12/04/2021  PCP: Montel Culver, MD   REFERRING PROVIDER: Montel Culver, MD   END OF SESSION:   PT End of Session - 12/04/21 0847     Visit Number 5    Number of Visits 13    Date for PT Re-Evaluation 12/19/21    Authorization Type [facility]    Authorization Time Period FOTO v6, v10    PT Start Time 0847    PT Stop Time 0929    PT Time Calculation (min) 42 min    Activity Tolerance Patient tolerated treatment well    Behavior During Therapy [facility] for tasks assessed/performed               Past Medical History:  Diagnosis Date   Acquired absence of both breasts 05/08/2015   Allergy    BRCA positive    BRCA2 gene mutation positive 12/26/2018   Hyperlipidemia 02/06/2021   Iron deficiency anemia 06/11/2014   Plantar fasciitis    bilateral   Poisoning, snake bite 11/2019   [place]   Psoriasis    Tick bite of ankle    right   Past Surgical History:  Procedure Laterality Date   ABDOMINAL HYSTERECTOMY  06/2014   ABDOMINOPLASTY  2006   BREAST ENHANCEMENT SURGERY Bilateral 03/2015   BREAST SURGERY Bilateral 12/18/2020   Revision   MASTECTOMY Bilateral 03/2015   ROTATOR CUFF REPAIR Right 02/2017   WISDOM TOOTH EXTRACTION Bilateral    Patient Active Problem List   Diagnosis Date Noted   Ganglion of lower leg 11/26/2021   Obesity, Class I, BMI 30-34.9 10/23/2021   Chronic left-sided low back pain with left-sided sciatica 09/25/2021   Encounter for weight management 08/28/2021   Acute laryngitis 05/13/2021   Sense of smell altered 03/12/2021   Routine screening for STI (sexually transmitted infection) 03/12/2021   Elevated blood-pressure reading without diagnosis of hypertension 02/06/2021   Dietary counseling and surveillance 02/06/2021   Vitamin D deficiency 02/06/2021   Annual physical exam 02/06/2021    Psoriasis    Severe obesity (BMI 35.0-35.9 with comorbidity) ([place]) 02/21/2018    REFERRING DIAG: Chronic left-sided low back pain with left-sided sciatica  THERAPY DIAG:  Other low back pain  Muscle weakness (generalized)  Rationale for Evaluation and Treatment Rehabilitation  PERTINENT HISTORY: None   PRECAUTIONS: None   SUBJECTIVE:  Patient reports a flare up of her pain on Tuesday attributed to assisting with her sister in laws childbirth and then driving home after this. She reports the pain was better on Wednesday, but then yesterday she was walking her dog and felt pain again. The pain is still there today, but not as bad.  PAIN:  Are you having pain? yes NPRS scale: 2/10  Pain location: Left SIJ Pain description: Stiff; stuck Aggravating factors: prolonged sitting Relieving factors: Stretching  PATIENT GOALS: "Do stuff safely" (exercise, lifting, etc.)    OBJECTIVE: (objective measures completed at initial evaluation unless otherwise dated) PATIENT SURVEYS:  FOTO 71% function to 81% predicted   MUSCLE LENGTH: Hamstrings: Right lacking 40 degrees; Left lacking 40 degrees 11/27/21: left lacking 38 degrees; right lacking 40 degrees    POSTURE:  Rounded shoulders, forward head, and decreased lumbar lordosis    PALPATION: TTP Lt superior ilium, glute max  Lumbar PAIVM hypomobile, pain free  12/04/21: Lt anterior rotated innominate    LUMBAR ROM:  Active  A/PROM  eval 11/27/21  Flexion 25% limited mild LBP 25% limited mild LBP  Extension 25% limited tightness WNL  Right lateral flexion WNL   Left lateral flexion WNL   Right rotation WNL   Left rotation WNL      LOWER EXTREMITY MMT:     MMT Right eval Left eval  Hip flexion 5 5  Hip extension 4 pn! 4-pn!  Hip abduction 4+ pn! 4+  Hip adduction      Hip internal rotation      Hip external rotation      Knee flexion 5 5  Knee extension 5 5  Ankle dorsiflexion      Ankle plantarflexion       Ankle inversion      Ankle eversion      Core 3-/5   LUMBAR SPECIAL TESTS:  SLR (-)  Long Sit (-) FABER (-) Sacral thrust (-)  SI compression/distraction (-)  Trendelenburg (+) [place] (+)   11/25/2021: Ober's (+) on Lt  12/04/21: (+) Long sit    FUNCTIONAL TESTS:  Squat: limited depth, foot ER    GAIT: Distance walked: 10 ft Assistive device utilized: None Level of assistance: Complete Independence Comments: Trendelenburg      TODAY'S TREATMENT  [facility] Adult PT Treatment:                                                DATE: 12/04/21 Therapeutic Exercise: Elliptical level 5 x 5 minutes  Piriformis isometric 2 x 10; 5 sec hold Frog bridge 2 x 10 Hip flexor stretch lunge x 60 sec each  Dead bug 1 x 10  Manual Therapy: DTM Lt posterior gluteals, piriformis Muscle energy technique to improve pelvic alignment Demonstrated and issued tennis ball for self soft tissue mobilization     [facility] Adult PT Treatment:                                                DATE: 11/27/21 Therapeutic Exercise: Elliptical x 5 minutes level 4  Seated figure 4 x 30 sec each Seated pelvic tilts 1 x 10  Supine TA 03/18/2024x 10  Dead bug LE only 2 x 10  Hip bridge with single leg knee extension 2 x 10 Cat cow 1 x 10   Quadruped knee extension 2 x 10  Lateral band walks 4 x 15 ft; blue band at shins  Updated HEP [facility] Adult PT Treatment:                                                DATE: 11/25/2021 Therapeutic Exercise: Knee plank 3x18min Prone press-up 3x30sec Side knee plank with clamshells 2x10 BIL Long-arm reverse crunch with 2# ball in hands 3x8 Standing IT band stretch with overhead reach x24min BIL [place] deadlift with 45# bar 3x8 Windmill streches 3x10 Standing Cybex hip abduction with 25# 2x10 BIL      PATIENT EDUCATION:  Education details: HEP update Person educated: Patient Education method: Explanation, Demonstration, Tactile cues, Verbal cues, Education comprehension:  verbalized understanding, returned demonstration, verbal cues  required   HOME EXERCISE PROGRAM: Access Code: [email or web address] URL: [email or web address] Date: 11/25/2021 Prepared by: Vanessa [place]  Exercises - Supine Posterior Pelvic Tilt  - 1 x daily - 7 x weekly - 2 sets - 10 reps - 5 sec hold - Supine Lower Trunk Rotation  - 1 x daily - 7 x weekly - 2 sets - 10 reps - Supine Bridge with Gluteal Set and Spinal Articulation  - 1 x daily - 7 x weekly - 2 sets - 10 reps - Clam with Resistance  - 1 x daily - 2 sets - 15 reps - Modified Thomas Stretch  - 1 x daily - 7 x weekly - 3 sets - 30 sec hold - Seated Hamstring Stretch  - 1 x daily - 7 x weekly - 3 sets - 30 sec hold - Half Kneeling Hip Flexor Stretch  - 1 x daily - 7 x weekly - 5 sets - 10 seconds hold   Added 11/25/2021: - Standing ITB Stretch  - 1 x daily - 7 x weekly - 2 sets - 1-min hold - Plank on Knees  - 1 x daily - 7 x weekly - 3 sets - 60-seconds hold    ASSESSMENT: CLINICAL IMPRESSION: Patient noted to have Lt anterior rotated innominate that easily corrects with muscle energy technique. She is noted to have significant tautness and palpable tenderness about posterior gluteal musculature most notable along sacral attachment. She will potentially benefit from TPDN at future sessions if tautness remains. Focused on hip strengthening, which she tolerated well without reports of increased pain.      OBJECTIVE IMPAIRMENTS Abnormal gait, decreased mobility, decreased ROM, decreased strength, hypomobility, impaired flexibility, improper body mechanics, postural dysfunction, and pain.    ACTIVITY LIMITATIONS carrying, lifting, bending, and squatting   PARTICIPATION LIMITATIONS: community activity, yard work, and exercise routine   PERSONAL FACTORS Age, Fitness, and Time since onset of injury/illness/exacerbation are also affecting patient's functional outcome.      GOALS: SHORT TERM GOALS: Target date:  11/27/2021   Patient will improve hamstring flexibility by 10 degrees to reduce stress on her low back.  Baseline:see above Goal status: ongoing    2.  Patient will demonstrate proper squat mechanics without cues.  Baseline: see above Goal status: INITIAL   3.  Patient will demonstrate pain free lumbar AROM to improve ability to complete bending and reaching activity.  Baseline: see above Goal status: ongoing    LONG TERM GOALS: Target date: 12/18/2021   Patient will demonstrate proper form with LE CKC strengthening (squat, lunge, etc) with moderate loads without an increase in back pain.  Baseline: unable/fearful Goal status: INITIAL   2.  Patient will demonstrate 5/5 pain free hip strength to improve stability about the chain for high impact exercise.  Baseline: see above Goal status: INITIAL   3.  Patient will demonstrate ability to properly engage core musculature with standing activity to reduce stress on her low back.  Baseline: see above Goal status: INITIAL   4.  Patient will be independent with advanced home program to assist in management of her chronic condition.  Baseline: n/a Goal status: INITIAL    PLAN: PT FREQUENCY: 2x/week   PT DURATION: 6 weeks   PLANNED INTERVENTIONS: Therapeutic exercises, Therapeutic activity, Neuromuscular re-education, Balance training, Gait training, Patient/Family education, Dry Needling, Spinal manipulation, Spinal mobilization, Cryotherapy, Moist heat, Traction, Manual therapy, and Re-evaluation.   PLAN FOR NEXT SESSION: review HEP, core strengthening, lumbar mobility, hamstring/quad stretching, body mechanics  with lifting/bending Gwendolyn Grant, PT, DPT, ATC 12/04/21 9:29 AM

## 2021-12-07 ENCOUNTER — Ambulatory Visit: Payer: 59

## 2021-12-07 ENCOUNTER — Encounter: Payer: 59 | Admitting: Physical Therapy

## 2021-12-07 DIAGNOSIS — M6281 Muscle weakness (generalized): Secondary | ICD-10-CM | POA: Diagnosis not present

## 2021-12-07 DIAGNOSIS — M5459 Other low back pain: Secondary | ICD-10-CM | POA: Diagnosis not present

## 2021-12-07 NOTE — Therapy (Signed)
OUTPATIENT PHYSICAL THERAPY TREATMENT NOTE   Patient Name: Brooke Graves MRN: [id number] DOB:Sep 09, 1975, 46 y.o., female Today's Date: 12/07/2021  PCP: Montel Culver, MD   REFERRING PROVIDER: Montel Culver, MD   END OF SESSION:   PT End of Session - 12/07/21 0938     Visit Number 6    Number of Visits 13    Date for PT Re-Evaluation 12/19/21    Authorization Type [facility]    Authorization Time Period FOTO v6, v10    PT Start Time [phone]   patient late   PT Stop Time 1014    PT Time Calculation (min) 35 min    Activity Tolerance Patient tolerated treatment well    Behavior During Therapy [facility] for tasks assessed/performed                Past Medical History:  Diagnosis Date   Acquired absence of both breasts 05/08/2015   Allergy    BRCA positive    BRCA2 gene mutation positive 12/26/2018   Hyperlipidemia 02/06/2021   Iron deficiency anemia 06/11/2014   Plantar fasciitis    bilateral   Poisoning, snake bite 11/2019   [place]   Psoriasis    Tick bite of ankle    right   Past Surgical History:  Procedure Laterality Date   ABDOMINAL HYSTERECTOMY  06/2014   ABDOMINOPLASTY  2006   BREAST ENHANCEMENT SURGERY Bilateral 03/2015   BREAST SURGERY Bilateral 12/18/2020   Revision   MASTECTOMY Bilateral 03/2015   ROTATOR CUFF REPAIR Right 02/2017   WISDOM TOOTH EXTRACTION Bilateral    Patient Active Problem List   Diagnosis Date Noted   Ganglion of lower leg 11/26/2021   Obesity, Class I, BMI 30-34.9 10/23/2021   Chronic left-sided low back pain with left-sided sciatica 09/25/2021   Encounter for weight management 08/28/2021   Acute laryngitis 05/13/2021   Sense of smell altered 03/12/2021   Routine screening for STI (sexually transmitted infection) 03/12/2021   Elevated blood-pressure reading without diagnosis of hypertension 02/06/2021   Dietary counseling and surveillance 02/06/2021   Vitamin D deficiency 02/06/2021   Annual physical exam  02/06/2021   Psoriasis    Severe obesity (BMI 35.0-35.9 with comorbidity) ([place]) 02/21/2018    REFERRING DIAG: Chronic left-sided low back pain with left-sided sciatica  THERAPY DIAG:  Other low back pain  Muscle weakness (generalized)  Rationale for Evaluation and Treatment Rehabilitation  PERTINENT HISTORY: None   PRECAUTIONS: None   SUBJECTIVE: Patient reports she is sore in the area where manual therapy was performed at last session.   PAIN:  Are you having pain? yes NPRS scale: 3/10  Pain location: Left SIJ Pain description: Stiff; stuck; sore  Aggravating factors: prolonged sitting Relieving factors: Stretching  PATIENT GOALS: "Do stuff safely" (exercise, lifting, etc.)    OBJECTIVE: (objective measures completed at initial evaluation unless otherwise dated) PATIENT SURVEYS:  FOTO 71% function to 81% predicted 12/07/21: 70% function    MUSCLE LENGTH: Hamstrings: Right lacking 40 degrees; Left lacking 40 degrees 11/27/21: left lacking 38 degrees; right lacking 40 degrees  12/07/21: right lacking 34 degrees; left lacking 28 degrees    POSTURE:  Rounded shoulders, forward head, and decreased lumbar lordosis    PALPATION: TTP Lt superior ilium, glute max  Lumbar PAIVM hypomobile, pain free  12/04/21: Lt anterior rotated innominate      LUMBAR ROM:    Active  A/PROM  eval 11/27/21 12/07/21  Flexion 25% limited mild LBP 25% limited  mild LBP 25% limited   Extension 25% limited tightness WNL WNL  Right lateral flexion WNL  WNL  Left lateral flexion WNL  WNL  Right rotation WNL  WNL  Left rotation WNL  WNL     LOWER EXTREMITY MMT:     MMT Right eval Left eval  Hip flexion 5 5  Hip extension 4 pn! 4-pn!  Hip abduction 4+ pn! 4+  Hip adduction      Hip internal rotation      Hip external rotation      Knee flexion 5 5  Knee extension 5 5  Ankle dorsiflexion      Ankle plantarflexion      Ankle inversion      Ankle eversion      Core 3-/5    LUMBAR SPECIAL TESTS:  SLR (-)  Long Sit (-) FABER (-) Sacral thrust (-)  SI compression/distraction (-)  Trendelenburg (+) [place] (+)   11/25/2021: Ober's (+) on Lt  12/04/21: (+) Long sit    FUNCTIONAL TESTS:  Squat: limited depth, foot ER    GAIT: Distance walked: 10 ft Assistive device utilized: None Level of assistance: Complete Independence Comments: Trendelenburg      TODAY'S TREATMENT  [facility] Adult PT Treatment:                                                DATE: 12/07/21 Therapeutic Exercise: Elliptical level 5 x 5 minutes  Dead bug 3 x 10  Quadruped leg extension 2 x 10  Fire hydrant 2 x 10  3 way hip on airex 1 x 10 each  Marching on airex 1 x 10    [facility] Adult PT Treatment:                                                DATE: 12/04/21 Therapeutic Exercise: Elliptical level 5 x 5 minutes  Piriformis isometric 2 x 10; 5 sec hold Frog bridge 2 x 10 Hip flexor stretch lunge x 60 sec each  Dead bug 1 x 10  Manual Therapy: DTM Lt posterior gluteals, piriformis Muscle energy technique to improve pelvic alignment Demonstrated and issued tennis ball for self soft tissue mobilization     [facility] Adult PT Treatment:                                                DATE: 11/27/21 Therapeutic Exercise: Elliptical x 5 minutes level 4  Seated figure 4 x 30 sec each Seated pelvic tilts 1 x 10  Supine TA 03/05/24x 10  Dead bug LE only 2 x 10  Hip bridge with single leg knee extension 2 x 10 Cat cow 1 x 10   Quadruped knee extension 2 x 10  Lateral band walks 4 x 15 ft; blue band at shins  Updated HEP       PATIENT EDUCATION:  Education details: N/A Person educated: N/A Education method:N/A Education comprehension: N/A   HOME EXERCISE PROGRAM: Access Code: [email or web address] URL: [email or web address] Date: 11/25/2021 Prepared by: Vanessa [place]  Exercises - Supine Posterior  Pelvic Tilt  - 1 x daily - 7 x weekly - 2 sets - 10 reps - 5 sec hold -  Supine Lower Trunk Rotation  - 1 x daily - 7 x weekly - 2 sets - 10 reps - Supine Bridge with Gluteal Set and Spinal Articulation  - 1 x daily - 7 x weekly - 2 sets - 10 reps - Clam with Resistance  - 1 x daily - 2 sets - 15 reps - Modified Thomas Stretch  - 1 x daily - 7 x weekly - 3 sets - 30 sec hold - Seated Hamstring Stretch  - 1 x daily - 7 x weekly - 3 sets - 30 sec hold - Half Kneeling Hip Flexor Stretch  - 1 x daily - 7 x weekly - 5 sets - 10 seconds hold   Added 11/25/2021: - Standing ITB Stretch  - 1 x daily - 7 x weekly - 2 sets - 1-min hold - Plank on Knees  - 1 x daily - 7 x weekly - 3 sets - 60-seconds hold    ASSESSMENT: CLINICAL IMPRESSION: Session somewhat limited as patient was late for scheduled appointment. Patient tolerated session well today focusing on progression of core stabilization and hip strengthening. She demonstrates pain free lumbar AROM having met this STG. Her hamstring flexibility has slightly improved compared to baseline, having partially met this STG. FOTO score has remained unchanged compared to initial evaluation, despite patient's subjective reports of improvement in pain/function. She has mild difficulty maintaining lumbopelvic stability with quadruped strengthening exercises, especially when LLE is support leg. No signs of pelvic drop with standing activity.      OBJECTIVE IMPAIRMENTS Abnormal gait, decreased mobility, decreased ROM, decreased strength, hypomobility, impaired flexibility, improper body mechanics, postural dysfunction, and pain.    ACTIVITY LIMITATIONS carrying, lifting, bending, and squatting   PARTICIPATION LIMITATIONS: community activity, yard work, and exercise routine   PERSONAL FACTORS Age, Fitness, and Time since onset of injury/illness/exacerbation are also affecting patient's functional outcome.      GOALS: SHORT TERM GOALS: Target date: 11/27/2021   Patient will improve hamstring flexibility by 10 degrees to reduce  stress on her low back.  Baseline:see above Goal status: partially met     2.  Patient will demonstrate proper squat mechanics without cues.  Baseline: see above Goal status: INITIAL   3.  Patient will demonstrate pain free lumbar AROM to improve ability to complete bending and reaching activity.  Baseline: see above Goal status: achieved     LONG TERM GOALS: Target date: 12/18/2021   Patient will demonstrate proper form with LE CKC strengthening (squat, lunge, etc) with moderate loads without an increase in back pain.  Baseline: unable/fearful Goal status: INITIAL   2.  Patient will demonstrate 5/5 pain free hip strength to improve stability about the chain for high impact exercise.  Baseline: see above Goal status: INITIAL   3.  Patient will demonstrate ability to properly engage core musculature with standing activity to reduce stress on her low back.  Baseline: see above Goal status: INITIAL   4.  Patient will be independent with advanced home program to assist in management of her chronic condition.  Baseline: n/a Goal status: INITIAL    PLAN: PT FREQUENCY: 2x/week   PT DURATION: 6 weeks   PLANNED INTERVENTIONS: Therapeutic exercises, Therapeutic activity, Neuromuscular re-education, Balance training, Gait training, Patient/Family education, Dry Needling, Spinal manipulation, Spinal mobilization, Cryotherapy, Moist heat, Traction, Manual therapy, and Re-evaluation.   PLAN  FOR NEXT SESSION: review HEP, core strengthening, lumbar mobility, hamstring/quad stretching, body mechanics with lifting/bending  Gwendolyn Grant, PT, DPT, ATC 12/07/21 10:37 AM

## 2021-12-09 ENCOUNTER — Encounter: Payer: Self-pay | Admitting: Family Medicine

## 2021-12-10 ENCOUNTER — Ambulatory Visit: Payer: 59 | Admitting: Physical Therapy

## 2021-12-10 NOTE — Telephone Encounter (Signed)
Please advise, 1st letter wrote 10/16/2021

## 2021-12-10 NOTE — Telephone Encounter (Signed)
Note sent

## 2021-12-14 ENCOUNTER — Other Ambulatory Visit: Payer: Self-pay

## 2021-12-14 ENCOUNTER — Ambulatory Visit: Payer: 59 | Attending: Family Medicine | Admitting: Physical Therapy

## 2021-12-14 ENCOUNTER — Encounter: Payer: Self-pay | Admitting: Physical Therapy

## 2021-12-14 DIAGNOSIS — M6281 Muscle weakness (generalized): Secondary | ICD-10-CM | POA: Insufficient documentation

## 2021-12-14 DIAGNOSIS — M5459 Other low back pain: Secondary | ICD-10-CM | POA: Insufficient documentation

## 2021-12-14 NOTE — Therapy (Signed)
OUTPATIENT PHYSICAL THERAPY TREATMENT NOTE   Patient Name: Brooke Graves MRN: [id number] DOB:February 22, 1976, 46 y.o., female Today's Date: 12/14/2021  PCP: Montel Culver, MD   REFERRING PROVIDER: Montel Culver, MD   END OF SESSION:   PT End of Session - 12/14/21 0927     Visit Number 7    Number of Visits 13    Date for PT Re-Evaluation 12/19/21    Authorization Type [facility]    PT Start Time 0924    PT Stop Time 0355    PT Time Calculation (min) 38 min    Activity Tolerance Patient tolerated treatment well    Behavior During Therapy [facility] for tasks assessed/performed                 Past Medical History:  Diagnosis Date   Acquired absence of both breasts 05/08/2015   Allergy    BRCA positive    BRCA2 gene mutation positive 12/26/2018   Hyperlipidemia 02/06/2021   Iron deficiency anemia 06/11/2014   Plantar fasciitis    bilateral   Poisoning, snake bite 11/2019   [place]   Psoriasis    Tick bite of ankle    right   Past Surgical History:  Procedure Laterality Date   ABDOMINAL HYSTERECTOMY  06/2014   ABDOMINOPLASTY  2006   BREAST ENHANCEMENT SURGERY Bilateral 03/2015   BREAST SURGERY Bilateral 12/18/2020   Revision   MASTECTOMY Bilateral 03/2015   ROTATOR CUFF REPAIR Right 02/2017   WISDOM TOOTH EXTRACTION Bilateral    Patient Active Problem List   Diagnosis Date Noted   Ganglion of lower leg 11/26/2021   Obesity, Class I, BMI 30-34.9 10/23/2021   Chronic left-sided low back pain with left-sided sciatica 09/25/2021   Encounter for weight management 08/28/2021   Acute laryngitis 05/13/2021   Sense of smell altered 03/12/2021   Routine screening for STI (sexually transmitted infection) 03/12/2021   Elevated blood-pressure reading without diagnosis of hypertension 02/06/2021   Dietary counseling and surveillance 02/06/2021   Vitamin D deficiency 02/06/2021   Annual physical exam 02/06/2021   Psoriasis    Severe obesity (BMI 35.0-35.9  with comorbidity) ([place]) 02/21/2018    REFERRING DIAG: Chronic left-sided low back pain with left-sided sciatica  THERAPY DIAG:  Other low back pain  Muscle weakness (generalized)  Rationale for Evaluation and Treatment Rehabilitation  PERTINENT HISTORY: None   PRECAUTIONS: None   SUBJECTIVE: Patient reports she has been sick the past few days so has had to cancel last few appointments. She also notes that while she was sick she didn't do much, but has been doing the stretches over the past three days so has had some soreness.  PAIN:  Are you having pain? yes NPRS scale: 0/10  Pain location: Left SIJ Pain description: Stiff; stuck; sore  Aggravating factors: prolonged sitting Relieving factors: Stretching  PATIENT GOALS: "Do stuff safely" (exercise, lifting, etc.)    OBJECTIVE: (objective measures completed at initial evaluation unless otherwise dated) PATIENT SURVEYS:  FOTO 71% function to 81% predicted 12/07/21: 70% function    MUSCLE LENGTH: Hamstrings: Right lacking 40 degrees; Left lacking 40 degrees 11/27/21: left lacking 38 degrees; right lacking 40 degrees  12/07/21: right lacking 34 degrees; left lacking 28 degrees    POSTURE:  Rounded shoulders, forward head, and decreased lumbar lordosis    PALPATION: TTP Lt superior ilium, glute max  Lumbar PAIVM hypomobile, pain free  12/04/21: Lt anterior rotated innominate      LUMBAR ROM:  Active  A/PROM  eval 11/27/21 12/07/21  Flexion 25% limited mild LBP 25% limited mild LBP 25% limited   Extension 25% limited tightness WNL WNL  Right lateral flexion WNL  WNL  Left lateral flexion WNL  WNL  Right rotation WNL  WNL  Left rotation WNL  WNL   LOWER EXTREMITY MMT:     MMT Right eval Left eval  Hip flexion 5 5  Hip extension 4 pn! 4-pn!  Hip abduction 4+ pn! 4+  Hip adduction      Hip internal rotation      Hip external rotation      Knee flexion 5 5  Knee extension 5 5  Ankle dorsiflexion       Ankle plantarflexion      Ankle inversion      Ankle eversion      Core 3-/5   LUMBAR SPECIAL TESTS:  SLR (-)  Long Sit (-) FABER (-) Sacral thrust (-)  SI compression/distraction (-)  Trendelenburg (+) [place] (+)   11/25/2021: Ober's (+) on Lt  12/04/21: (+) Long sit    FUNCTIONAL TESTS:  Squat: limited depth, foot ER    GAIT: Distance walked: 10 ft Assistive device utilized: None Level of assistance: Complete Independence Comments: Trendelenburg      TODAY'S TREATMENT  [facility] Adult PT Treatment:                                                DATE: 12/14/21 Therapeutic Exercise: Elliptical L1 R! X 5 min while taking subjective  Piriformis stretch 2 x 30 sec each LTR x 10 each Figure-4 bridge 2 x 10 each Sidelying hip abduction 2 x 10 each Standing hydrant with green 2 x 10 each Runner step-up 8' box 2 x 10 each   [facility] Adult PT Treatment:                                                DATE: 12/07/21 Therapeutic Exercise: Elliptical level 5 x 5 minutes  Dead bug 3 x 10  Quadruped leg extension 2 x 10  Fire hydrant 2 x 10  3 way hip on airex 1 x 10 each  Marching on airex 1 x 10   [facility] Adult PT Treatment:                                                DATE: 12/04/21 Therapeutic Exercise: Elliptical level 5 x 5 minutes  Piriformis isometric 2 x 10; 5 sec hold Frog bridge 2 x 10 Hip flexor stretch lunge x 60 sec each  Dead bug 1 x 10  Manual Therapy: DTM Lt posterior gluteals, piriformis Muscle energy technique to improve pelvic alignment Demonstrated and issued tennis ball for self soft tissue mobilization    PATIENT EDUCATION:  Education details: HEP Person educated: Patient Education method: Consulting civil engineer, Demonstration, Corporate treasurer cues, Verbal cues Education comprehension: verbalized understanding, returned demonstration, verbal cues required, tactile cues required, and needs further education   HOME EXERCISE PROGRAM: Access Code: [email or web address]     ASSESSMENT: CLINICAL IMPRESSION: Patient tolerated therapy well  with no adverse effects. Therapy continues to focus on progressing core and hip strength and lumbopelvic control. She did have to cancel a couple of visits due to illness. She did not report any increased pain with therapy but does require cueing for lumbopelvic control to avoid hip drop and avoid excessive lumbar lordosis. No changes to HEP this visit. Patient would benefit from continued skilled PT to progress her strength and control to reduce pain and maximize functional ability.     OBJECTIVE IMPAIRMENTS Abnormal gait, decreased mobility, decreased ROM, decreased strength, hypomobility, impaired flexibility, improper body mechanics, postural dysfunction, and pain.    ACTIVITY LIMITATIONS carrying, lifting, bending, and squatting   PARTICIPATION LIMITATIONS: community activity, yard work, and exercise routine   PERSONAL FACTORS Age, Fitness, and Time since onset of injury/illness/exacerbation are also affecting patient's functional outcome.      GOALS: SHORT TERM GOALS: Target date: 11/27/2021   Patient will improve hamstring flexibility by 10 degrees to reduce stress on her low back.  Baseline:see above Goal status: partially met     2.  Patient will demonstrate proper squat mechanics without cues.  Baseline: see above Goal status: INITIAL   3.  Patient will demonstrate pain free lumbar AROM to improve ability to complete bending and reaching activity.  Baseline: see above Goal status: achieved     LONG TERM GOALS: Target date: 12/18/2021   Patient will demonstrate proper form with LE CKC strengthening (squat, lunge, etc) with moderate loads without an increase in back pain.  Baseline: unable/fearful Goal status: INITIAL   2.  Patient will demonstrate 5/5 pain free hip strength to improve stability about the chain for high impact exercise.  Baseline: see above Goal status: INITIAL   3.  Patient will  demonstrate ability to properly engage core musculature with standing activity to reduce stress on her low back.  Baseline: see above Goal status: INITIAL   4.  Patient will be independent with advanced home program to assist in management of her chronic condition.  Baseline: n/a Goal status: INITIAL    PLAN: PT FREQUENCY: 2x/week   PT DURATION: 6 weeks   PLANNED INTERVENTIONS: Therapeutic exercises, Therapeutic activity, Neuromuscular re-education, Balance training, Gait training, Patient/Family education, Dry Needling, Spinal manipulation, Spinal mobilization, Cryotherapy, Moist heat, Traction, Manual therapy, and Re-evaluation.   PLAN FOR NEXT SESSION: review HEP, core strengthening, lumbar mobility, hamstring/quad stretching, body mechanics with lifting/bending   Hilda Blades, PT, DPT, LAT, ATC 12/14/21  10:02 AM Phone: [phone] Fax: [phone]

## 2021-12-15 NOTE — Telephone Encounter (Signed)
Please advise      KP 

## 2021-12-16 NOTE — Therapy (Signed)
OUTPATIENT PHYSICAL THERAPY TREATMENT NOTE   Patient Name: Brooke Graves MRN: [id number] DOB:[birth year], 46 y.o., female Today's Date: 12/16/2021  PCP: Montel Culver, MD   REFERRING PROVIDER: Montel Culver, MD   END OF SESSION:         Past Medical History:  Diagnosis Date   Acquired absence of both breasts 05/08/2015   Allergy    BRCA positive    BRCA2 gene mutation positive 12/26/2018   Hyperlipidemia 02/06/2021   Iron deficiency anemia 06/11/2014   Plantar fasciitis    bilateral   Poisoning, snake bite 11/2019   [place]   Psoriasis    Tick bite of ankle    right   Past Surgical History:  Procedure Laterality Date   ABDOMINAL HYSTERECTOMY  06/2014   ABDOMINOPLASTY  2006   BREAST ENHANCEMENT SURGERY Bilateral 03/2015   BREAST SURGERY Bilateral 12/18/2020   Revision   MASTECTOMY Bilateral 03/2015   ROTATOR CUFF REPAIR Right 02/2017   WISDOM TOOTH EXTRACTION Bilateral    Patient Active Problem List   Diagnosis Date Noted   Ganglion of lower leg 11/26/2021   Obesity, Class I, BMI 30-34.9 10/23/2021   Chronic left-sided low back pain with left-sided sciatica 09/25/2021   Encounter for weight management 08/28/2021   Acute laryngitis 05/13/2021   Sense of smell altered 03/12/2021   Routine screening for STI (sexually transmitted infection) 03/12/2021   Elevated blood-pressure reading without diagnosis of hypertension 02/06/2021   Dietary counseling and surveillance 02/06/2021   Vitamin D deficiency 02/06/2021   Annual physical exam 02/06/2021   Psoriasis    Severe obesity (BMI 35.0-35.9 with comorbidity) ([place]) 02/21/2018    REFERRING DIAG: Chronic left-sided low back pain with left-sided sciatica  THERAPY DIAG:  No diagnosis found.  Rationale for Evaluation and Treatment Rehabilitation  PERTINENT HISTORY: None   PRECAUTIONS: None   SUBJECTIVE: Patient reports she has been sick the past few days so has had to cancel last few  appointments. She also notes that while she was sick she didn't do much, but has been doing the stretches over the past three days so has had some soreness.  PAIN:  Are you having pain? yes NPRS scale: 0/10  Pain location: Left SIJ Pain description: Stiff; stuck; sore  Aggravating factors: prolonged sitting Relieving factors: Stretching  PATIENT GOALS: "Do stuff safely" (exercise, lifting, etc.)    OBJECTIVE: (objective measures completed at initial evaluation unless otherwise dated) PATIENT SURVEYS:  FOTO 71% function to 81% predicted 12/07/21: 70% function    MUSCLE LENGTH: Hamstrings: Right lacking 40 degrees; Left lacking 40 degrees 11/27/21: left lacking 38 degrees; right lacking 40 degrees  12/07/21: right lacking 34 degrees; left lacking 28 degrees    POSTURE:  Rounded shoulders, forward head, and decreased lumbar lordosis    PALPATION: TTP Lt superior ilium, glute max  Lumbar PAIVM hypomobile, pain free  12/04/21: Lt anterior rotated innominate      LUMBAR ROM:    Active  A/PROM  eval 11/27/21 12/07/21  Flexion 25% limited mild LBP 25% limited mild LBP 25% limited   Extension 25% limited tightness WNL WNL  Right lateral flexion WNL  WNL  Left lateral flexion WNL  WNL  Right rotation WNL  WNL  Left rotation WNL  WNL   LOWER EXTREMITY MMT:     MMT Right eval Left eval  Hip flexion 5 5  Hip extension 4 pn! 4-pn!  Hip abduction 4+ pn! 4+  Hip adduction  Hip internal rotation      Hip external rotation      Knee flexion 5 5  Knee extension 5 5  Ankle dorsiflexion      Ankle plantarflexion      Ankle inversion      Ankle eversion      Core 3-/5   LUMBAR SPECIAL TESTS:  SLR (-)  Long Sit (-) FABER (-) Sacral thrust (-)  SI compression/distraction (-)  Trendelenburg (+) [place] (+)   11/25/2021: Ober's (+) on Lt  12/04/21: (+) Long sit    FUNCTIONAL TESTS:  Squat: limited depth, foot ER    GAIT: Distance walked: 10 ft Assistive device  utilized: None Level of assistance: Complete Independence Comments: Trendelenburg      TODAY'S TREATMENT  [facility] Adult PT Treatment:                                                DATE: 12/17/21 Therapeutic Exercise: Elliptical L1 R! X 5 min while taking subjective  Piriformis stretch 2 x 30 sec each LTR x 10 each Figure-4 bridge 2 x 10 each Sidelying hip abduction 2 x 10 each Standing hydrant with green 2 x 10 each Runner step-up 8' box 2 x 10 each   [facility] Adult PT Treatment:                                                DATE: 12/14/21 Therapeutic Exercise: Elliptical L1 R! X 5 min while taking subjective  Piriformis stretch 2 x 30 sec each LTR x 10 each Figure-4 bridge 2 x 10 each Sidelying hip abduction 2 x 10 each Standing hydrant with green 2 x 10 each Runner step-up 8' box 2 x 10 each  [facility] Adult PT Treatment:                                                DATE: 12/07/21 Therapeutic Exercise: Elliptical level 5 x 5 minutes  Dead bug 3 x 10  Quadruped leg extension 2 x 10  Fire hydrant 2 x 10  3 way hip on airex 1 x 10 each  Marching on airex 1 x 10   PATIENT EDUCATION:  Education details: HEP Person educated: Patient Education method: Consulting civil engineer, Media planner, Corporate treasurer cues, Verbal cues Education comprehension: verbalized understanding, returned demonstration, verbal cues required, tactile cues required, and needs further education   HOME EXERCISE PROGRAM: Access Code: [email or web address]    ASSESSMENT: CLINICAL IMPRESSION: Patient tolerated therapy well with no adverse effects. *** Patient would benefit from continued skilled PT to progress her strength and control to reduce pain and maximize functional ability.  Therapy continues to focus on progressing core and hip strength and lumbopelvic control. She did have to cancel a couple of visits due to illness. She did not report any increased pain with therapy but does require cueing for lumbopelvic control to avoid hip drop and  avoid excessive lumbar lordosis. No changes to HEP this visit.     OBJECTIVE IMPAIRMENTS Abnormal gait, decreased mobility, decreased ROM, decreased strength, hypomobility, impaired flexibility, improper body mechanics, postural  dysfunction, and pain.    ACTIVITY LIMITATIONS carrying, lifting, bending, and squatting   PARTICIPATION LIMITATIONS: community activity, yard work, and exercise routine   PERSONAL FACTORS Age, Fitness, and Time since onset of injury/illness/exacerbation are also affecting patient's functional outcome.      GOALS: SHORT TERM GOALS: Target date: 11/27/2021   Patient will improve hamstring flexibility by 10 degrees to reduce stress on her low back.  Baseline:see above Goal status: partially met     2.  Patient will demonstrate proper squat mechanics without cues.  Baseline: see above Goal status: INITIAL   3.  Patient will demonstrate pain free lumbar AROM to improve ability to complete bending and reaching activity.  Baseline: see above Goal status: achieved     LONG TERM GOALS: Target date: 12/18/2021   Patient will demonstrate proper form with LE CKC strengthening (squat, lunge, etc) with moderate loads without an increase in back pain.  Baseline: unable/fearful Goal status: INITIAL   2.  Patient will demonstrate 5/5 pain free hip strength to improve stability about the chain for high impact exercise.  Baseline: see above Goal status: INITIAL   3.  Patient will demonstrate ability to properly engage core musculature with standing activity to reduce stress on her low back.  Baseline: see above Goal status: INITIAL   4.  Patient will be independent with advanced home program to assist in management of her chronic condition.  Baseline: n/a Goal status: INITIAL    PLAN: PT FREQUENCY: 2x/week   PT DURATION: 6 weeks   PLANNED INTERVENTIONS: Therapeutic exercises, Therapeutic activity, Neuromuscular re-education, Balance training, Gait training,  Patient/Family education, Dry Needling, Spinal manipulation, Spinal mobilization, Cryotherapy, Moist heat, Traction, Manual therapy, and Re-evaluation.   PLAN FOR NEXT SESSION: review HEP, core strengthening, lumbar mobility, hamstring/quad stretching, body mechanics with lifting/bending   Hilda Blades, PT, DPT, LAT, ATC 12/16/21  2:00 PM Phone: [phone] Fax: [phone]

## 2021-12-17 ENCOUNTER — Other Ambulatory Visit: Payer: Self-pay

## 2021-12-17 ENCOUNTER — Encounter: Payer: Self-pay | Admitting: Physical Therapy

## 2021-12-17 ENCOUNTER — Ambulatory Visit: Payer: 59 | Admitting: Physical Therapy

## 2021-12-17 DIAGNOSIS — M6281 Muscle weakness (generalized): Secondary | ICD-10-CM

## 2021-12-17 DIAGNOSIS — M5459 Other low back pain: Secondary | ICD-10-CM | POA: Diagnosis not present

## 2021-12-17 NOTE — Patient Instructions (Signed)
Access Code: [email or web address] URL: [email or web address] Date: 12/17/2021 Prepared by: Hilda Blades  Exercises - Half Kneeling Hip Flexor Stretch  - 1 x daily - 7 x weekly - 5 sets - 10 seconds hold - Supine Lower Trunk Rotation  - 1 x daily - 10 reps - 5 seconds hold - Supine Piriformis Stretch with Foot on Ground  - 1 x daily - 2 reps - 30 seconds hold - Figure 4 Bridge  - 1 x daily - 3 sets - 10 reps - Side Plank with Clam and Resistance  - 1 x daily - 3 sets - 10 reps - Dead Bug with Swiss Ball  - 1 x daily - 3 sets - 10 reps - Plank on Knees  - 1 x daily - 3 sets - 60-seconds hold - Seated Hamstring Stretch  - 1 x daily - 7 x weekly - 3 sets - 30 sec hold - Seated Piriformis Stretch with Trunk Bend  - 1 x daily - 3 sets - 30 sec  hold - Seated Pelvic Tilt  - 10 reps - Standing ITB Stretch  - 1 x daily - 2 sets - 1-min hold

## 2021-12-21 ENCOUNTER — Encounter: Payer: 59 | Admitting: Physical Therapy

## 2021-12-25 ENCOUNTER — Ambulatory Visit: Payer: 59

## 2021-12-25 DIAGNOSIS — M6281 Muscle weakness (generalized): Secondary | ICD-10-CM | POA: Diagnosis not present

## 2021-12-25 DIAGNOSIS — M5459 Other low back pain: Secondary | ICD-10-CM | POA: Diagnosis not present

## 2021-12-25 NOTE — Therapy (Signed)
OUTPATIENT PHYSICAL THERAPY TREATMENT NOTE  PHYSICAL THERAPY DISCHARGE SUMMARY  Visits from Start of Care: 9  Current functional level related to goals / functional outcomes: See goals   Remaining deficits: See impression   Education / Equipment: See treatment    Patient agrees to discharge. Patient goals were met. Patient is being discharged due to meeting the stated rehab goals.  Patient Name: Brooke Graves MRN: [id number] DOB:Aug 02, 1975, 46 y.o., female Today's Date: 12/25/2021  PCP: Jerrol Banana, MD   REFERRING PROVIDER: Jerrol Banana, MD   END OF SESSION:   PT End of Session - 12/25/21 0855     Visit Number 9    Number of Visits 13    Date for PT Re-Evaluation 12/19/21    Authorization Type [facility] UMR    Authorization Time Period FOTO v6, v10    PT Start Time 0856   patient late   PT Stop Time 0929    PT Time Calculation (min) 33 min    Activity Tolerance Patient tolerated treatment well    Behavior During Therapy [facility] for tasks assessed/performed                   Past Medical History:  Diagnosis Date   Acquired absence of both breasts 05/08/2015   Allergy    BRCA positive    BRCA2 gene mutation positive 12/26/2018   Hyperlipidemia 02/06/2021   Iron deficiency anemia 06/11/2014   Plantar fasciitis    bilateral   Poisoning, snake bite 11/2019   [place]   Psoriasis    Tick bite of ankle    right   Past Surgical History:  Procedure Laterality Date   ABDOMINAL HYSTERECTOMY  06/2014   ABDOMINOPLASTY  2006   BREAST ENHANCEMENT SURGERY Bilateral 03/2015   BREAST SURGERY Bilateral 12/18/2020   Revision   MASTECTOMY Bilateral 03/2015   ROTATOR CUFF REPAIR Right 02/2017   WISDOM TOOTH EXTRACTION Bilateral    Patient Active Problem List   Diagnosis Date Noted   Ganglion of lower leg 11/26/2021   Obesity, Class I, BMI 30-34.9 10/23/2021   Chronic left-sided low back pain with left-sided sciatica 09/25/2021   Encounter for  weight management 08/28/2021   Acute laryngitis 05/13/2021   Sense of smell altered 03/12/2021   Routine screening for STI (sexually transmitted infection) 03/12/2021   Elevated blood-pressure reading without diagnosis of hypertension 02/06/2021   Dietary counseling and surveillance 02/06/2021   Vitamin D deficiency 02/06/2021   Annual physical exam 02/06/2021   Psoriasis    Severe obesity (BMI 35.0-35.9 with comorbidity) ([facility]) 02/21/2018    REFERRING DIAG: Chronic left-sided low back pain with left-sided sciatica  THERAPY DIAG:  Other low back pain  Muscle weakness (generalized)  Rationale for Evaluation and Treatment Rehabilitation  PERTINENT HISTORY: None   PRECAUTIONS: None    SUBJECTIVE: Patient reports things have been going well. She was on a redeye flight and sitting for awhile and just has generalized discomfort. She reports having more soreness than pain with her daily activities. She denies any pain currently. She has plans to begin going to the gym next week and feels that she is ready for discharge at this time and can continue her exercises independently.   PAIN:  Are you having pain? No   PATIENT GOALS: "Do stuff safely" (exercise, lifting, etc.)    OBJECTIVE: (objective measures completed at initial evaluation unless otherwise dated) PATIENT SURVEYS:  FOTO 71% function to 81% predicted 12/07/21: 70% function  12/25/21: 83% function    MUSCLE LENGTH: Hamstrings: Right lacking 40 degrees; Left lacking 40 degrees 11/27/21: left lacking 38 degrees; right lacking 40 degrees  12/07/21: right lacking 34 degrees; left lacking 28 degrees  12/25/21: left lacking 30 degrees; right lacking 30 degrees      POSTURE:  Rounded shoulders, forward head, and decreased lumbar lordosis   PALPATION: TTP Lt superior ilium, glute max  Lumbar PAIVM hypomobile, pain free  12/04/21: Lt anterior rotated innominate   12/25/21: no palpable tenderness about L-spine or hips     LUMBAR ROM:    Active  A/PROM  eval 11/27/21 12/07/21 12/25/21  Flexion 25% limited mild LBP 25% limited mild LBP 25% limited  WNL  Extension 25% limited tightness WNL WNL WNL  Right lateral flexion WNL  WNL WNL  Left lateral flexion WNL  WNL WNL  Right rotation WNL  WNL WNL  Left rotation WNL  WNL WNL   LOWER EXTREMITY MMT:     MMT Right eval Left eval 12/25/21  Hip flexion 5 5 5/5 bilateral  Hip extension 4 pn! 4-pn! 5/5 bilateral  Hip abduction 4+ pn! 4+ 5/5 bilateral   Hip adduction       Hip internal rotation       Hip external rotation       Knee flexion 5 5   Knee extension 5 5   Ankle dorsiflexion       Ankle plantarflexion       Ankle inversion       Ankle eversion       Core 3-/5 4/5   LUMBAR SPECIAL TESTS:  SLR (-)  Long Sit (-) FABER (-) Sacral thrust (-)  SI compression/distraction (-)  Trendelenburg (+) [place] (+)   11/25/2021: Ober's (+) on Lt  12/04/21: (+) Long sit   12/25/21: (-) SLR (+) Ely's    FUNCTIONAL TESTS:  Squat: limited depth, foot ER    GAIT: Distance walked: 10 ft Assistive device utilized: None Level of assistance: Complete Independence Comments: Trendelenburg      TODAY'S TREATMENT  [facility] Adult PT Treatment:                                                DATE: 12/25/21 Therapeutic Exercise: Kettle bell squat x 8; 15 lbs Forward lunge x 6  Hamstring stretch with strap trial Quad stretch with strap trial Seated pelvic tilts x 10  Reviewed and updated HEP Discussed gym progression   Therapeutic Activity: Education on re-assessment findings and progress towards goals.    [facility] Adult PT Treatment:                                                DATE: 12/17/21 Therapeutic Exercise: Elliptical L1 R1 X 5 min while taking subjective  1/2 kneeling hip flexor/quad stretch 2 x 20 sec each Piriformis stretch 2 x 20 sec each LTR in figure-4 position 3 x 10 sec each Figure-4 bridge 2 x 10 each Modified side plank and clamshell with red  2 x 10 each Dead bug with physioball 2 x 10 Bird dog 2 x 10   [facility] Adult PT Treatment:  DATE: 12/14/21 Therapeutic Exercise: Elliptical L1 R! X 5 min while taking subjective  Piriformis stretch 2 x 30 sec each LTR x 10 each Figure-4 bridge 2 x 10 each Sidelying hip abduction 2 x 10 each Standing hydrant with green 2 x 10 each Runner step-up 8' box 2 x 10 each   PATIENT EDUCATION:  Education details:see treatment; D/C education  Person educated: Patient Education method: Explanation, Demonstration, Tactile cues, Verbal cues, Handout Education comprehension: verbalized understanding, returned demonstration   HOME EXERCISE PROGRAM: Access Code: [email or web address]    ASSESSMENT: CLINICAL IMPRESSION: Patient has progressed well since the start of care with overall improvements in her pain, lumbar mobility, hip and core strength, and tolerance to progression of gym specific activity. She has lingering tightness about hip musculature and was encouraged to continue stretching daily and complete targeted hip and core strengthening at least 3 times weekly with patient verbalizing understanding. She has met all established functional goals and demonstrates independence with advanced HEP. She is therefore appropriate for discharge with patient in agreement with this plan.    OBJECTIVE IMPAIRMENTS Abnormal gait, decreased mobility, decreased ROM, decreased strength, hypomobility, impaired flexibility, improper body mechanics, postural dysfunction, and pain.    ACTIVITY LIMITATIONS carrying, lifting, bending, and squatting   PARTICIPATION LIMITATIONS: community activity, yard work, and exercise routine   PERSONAL FACTORS Age, Fitness, and Time since onset of injury/illness/exacerbation are also affecting patient's functional outcome.      GOALS: SHORT TERM GOALS: Target date: 11/27/2021   Patient will improve hamstring flexibility by 10 degrees to reduce  stress on her low back.  Baseline:see above Goal status: achieved    2.  Patient will demonstrate proper squat mechanics without cues.  Baseline: see above Goal status: achieved    3.  Patient will demonstrate pain free lumbar AROM to improve ability to complete bending and reaching activity.  Baseline: see above Goal status: achieved     LONG TERM GOALS: Target date: 12/18/2021   Patient will demonstrate proper form with LE CKC strengthening (squat, lunge, etc) with moderate loads without an increase in back pain.  Baseline: unable/fearful Goal status: achieved    2.  Patient will demonstrate 5/5 pain free hip strength to improve stability about the chain for high impact exercise.  Baseline: see above Goal status: achieved    3.  Patient will demonstrate ability to properly engage core musculature with standing activity to reduce stress on her low back.  Baseline: see above Goal status: achieved    4.  Patient will be independent with advanced home program to assist in management of her chronic condition.  Baseline: n/a Goal status: achieved     PLAN: PT FREQUENCY: 2x/week   PT DURATION: 6 weeks   PLANNED INTERVENTIONS: Therapeutic exercises, Therapeutic activity, Neuromuscular re-education, Balance training, Gait training, Patient/Family education, Dry Needling, Spinal manipulation, Spinal mobilization, Cryotherapy, Moist heat, Traction, Manual therapy, and Re-evaluation.   PLAN FOR NEXT SESSION: n/a d/c  Gwendolyn Grant, PT, DPT, ATC 12/25/21 1:28 PM

## 2021-12-30 ENCOUNTER — Other Ambulatory Visit (HOSPITAL_COMMUNITY): Payer: Self-pay

## 2021-12-30 MED ORDER — RESTASIS 0.05 % OP EMUL
1.0000 [drp] | Freq: Two times a day (BID) | OPHTHALMIC | 4 refills | Status: DC
  Filled 2021-12-30: qty 180, 90d supply, fill #0
  Filled 2022-04-07: qty 180, 90d supply, fill #1
  Filled 2022-10-28: qty 180, 90d supply, fill #2

## 2022-01-01 ENCOUNTER — Other Ambulatory Visit (HOSPITAL_COMMUNITY): Payer: Self-pay

## 2022-01-01 ENCOUNTER — Other Ambulatory Visit: Payer: Self-pay | Admitting: Family Medicine

## 2022-01-01 DIAGNOSIS — E669 Obesity, unspecified: Secondary | ICD-10-CM

## 2022-01-01 MED ORDER — WEGOVY 2.4 MG/0.75ML ~~LOC~~ SOAJ
2.4000 mg | SUBCUTANEOUS | 0 refills | Status: DC
  Filled 2022-01-01: qty 9, 84d supply, fill #0

## 2022-01-01 NOTE — Telephone Encounter (Signed)
Requested Prescriptions  Pending Prescriptions Disp Refills  . WEGOVY 2.4 MG/0.75ML SOAJ 3 mL 0    Sig: Inject 2.4 mg into the skin once a week.     Endocrinology:  Diabetes - GLP-1 Receptor Agonists - semaglutide Failed - 01/01/2022  2:43 PM      Failed - HBA1C in normal range and within 180 days    No results found for: "HGBA1C", "LABA1C"       Passed - Cr in normal range and within 360 days    Creatinine, Ser  Date Value Ref Range Status  03/05/2021 0.86 0.57 - 1.00 mg/dL Final         Passed - Valid encounter within last 6 months    Recent Outpatient Visits          1 month ago Obesity, Class I, BMI 30-34.9   [facility] [place], Earley Abide, MD   2 months ago Obesity, Class I, BMI 30-34.9   [facility] [place], Earley Abide, MD   3 months ago Chronic left-sided low back pain with left-sided sciatica   [facility], Earley Abide, MD   4 months ago Severe obesity (BMI 35.0-35.9 with comorbidity) [facility])   [facility] [facility] at [facility], Earley Abide, MD   7 months ago Acute laryngitis   [facility] at [facility], Earley Abide, MD      Future Appointments            In 4 weeks Zigmund Daniel, Earley Abide, MD [facility] at [facility], [facility]

## 2022-01-07 ENCOUNTER — Other Ambulatory Visit (HOSPITAL_COMMUNITY): Payer: Self-pay

## 2022-01-07 MED ORDER — SODIUM FLUORIDE 5000 PPM 1.1 % DT PSTE
PASTE | DENTAL | 99 refills | Status: DC
  Filled 2022-01-07: qty 100, 30d supply, fill #0
  Filled 2022-04-07: qty 100, 30d supply, fill #1

## 2022-01-08 ENCOUNTER — Other Ambulatory Visit (HOSPITAL_COMMUNITY): Payer: Self-pay

## 2022-01-12 ENCOUNTER — Other Ambulatory Visit (HOSPITAL_COMMUNITY): Payer: Self-pay

## 2022-01-29 ENCOUNTER — Ambulatory Visit (INDEPENDENT_AMBULATORY_CARE_PROVIDER_SITE_OTHER): Payer: 59 | Admitting: Family Medicine

## 2022-01-29 ENCOUNTER — Encounter: Payer: Self-pay | Admitting: Family Medicine

## 2022-01-29 ENCOUNTER — Other Ambulatory Visit (HOSPITAL_COMMUNITY): Payer: Self-pay

## 2022-01-29 VITALS — BP 138/94 | HR 72 | Ht 67.0 in | Wt 204.0 lb

## 2022-01-29 DIAGNOSIS — Z0000 Encounter for general adult medical examination without abnormal findings: Secondary | ICD-10-CM | POA: Diagnosis not present

## 2022-01-29 DIAGNOSIS — Z7689 Persons encountering health services in other specified circumstances: Secondary | ICD-10-CM | POA: Diagnosis not present

## 2022-01-29 DIAGNOSIS — Z23 Encounter for immunization: Secondary | ICD-10-CM | POA: Diagnosis not present

## 2022-01-29 DIAGNOSIS — E785 Hyperlipidemia, unspecified: Secondary | ICD-10-CM | POA: Diagnosis not present

## 2022-01-29 DIAGNOSIS — E669 Obesity, unspecified: Secondary | ICD-10-CM

## 2022-01-29 DIAGNOSIS — R7989 Other specified abnormal findings of blood chemistry: Secondary | ICD-10-CM | POA: Diagnosis not present

## 2022-01-29 MED ORDER — WEGOVY 2.4 MG/0.75ML ~~LOC~~ SOAJ
2.4000 mg | SUBCUTANEOUS | 2 refills | Status: DC
  Filled 2022-01-29: qty 9, 84d supply, fill #0
  Filled 2022-03-09: qty 3, 28d supply, fill #0
  Filled 2022-03-12: qty 9, 84d supply, fill #0
  Filled 2022-04-07: qty 9, 84d supply, fill #1

## 2022-01-29 NOTE — Patient Instructions (Addendum)
-   Obtain fasting labs with orders provided (can have water or black coffee but otherwise no food or drink x 8 hours before labs) - Review information provided - Attend eye doctor annually, dentist every 6 months, work towards or maintain 30 minutes of moderate intensity physical activity at least 5 days per week, and consume a balanced diet - Return in 3 months - Contact us for any questions between now and then 

## 2022-01-29 NOTE — Assessment & Plan Note (Signed)
Annual influenza vaccination administered today. 

## 2022-01-29 NOTE — Assessment & Plan Note (Signed)
Recheck of labs ordered today

## 2022-01-29 NOTE — Assessment & Plan Note (Signed)
Patient has demonstrated continued weight loss and reports tolerance of Wegovy 2.4 mg, did discuss continued dosing until weight goals are achieved, working towards regular dietary and activity routine, and we will coordinate follow-up in 3 months.

## 2022-01-29 NOTE — Progress Notes (Signed)
Annual Physical Exam Visit  Patient Information:  Patient ID: Brooke Graves, female DOB: 05/07/1976 Age: 46 y.o. MRN: [id number]   Subjective:   CC: Annual Physical Exam  HPI:  Brooke Graves is here for their annual physical.  I reviewed the past medical history, family history, social history, surgical history, and allergies today and changes were made as necessary.  Please see the problem list section below for additional details.  Past Medical History: Past Medical History:  Diagnosis Date   Acquired absence of both breasts 05/08/2015   Allergy    BRCA positive    BRCA2 gene mutation positive 12/26/2018   Hyperlipidemia 02/06/2021   Iron deficiency anemia 06/11/2014   Plantar fasciitis    bilateral   Poisoning, snake bite 11/2019   [place]   Psoriasis    Tick bite of ankle    right   Past Surgical History: Past Surgical History:  Procedure Laterality Date   ABDOMINAL HYSTERECTOMY  06/2014   ABDOMINOPLASTY  2006   BREAST ENHANCEMENT SURGERY Bilateral 03/2015   BREAST SURGERY Bilateral 12/18/2020   Revision   MASTECTOMY Bilateral 03/2015   ROTATOR CUFF REPAIR Right 02/2017   WISDOM TOOTH EXTRACTION Bilateral    Family History: Family History  Problem Relation Age of Onset   Breast cancer Mother    Congenital heart disease Brother    Ovarian cancer Maternal Aunt    Kidney cancer Maternal Aunt    Breast cancer Maternal Grandmother    Allergies: Allergies  Allergen Reactions   Sulfamethoxazole-Trimethoprim Rash and Palpitations   Other Other (See Comments)   Pork Allergy Itching   Lactase-Lactobacillus Other (See Comments)    GI INTOLERANCE   Pork-Derived Products Other (See Comments)    GI INTOLERANCE   Tape Rash   Wound Dressing Adhesive Rash   Health Maintenance: Health Maintenance  Topic Date Due   COLONOSCOPY (Pts 45-44yrs Insurance coverage will need to be confirmed)  Never done   COVID-19 Vaccine (4 - Booster for  Novavax series) 11/06/2021   TETANUS/TDAP  08/29/2022 (Originally 10/05/1992)   INFLUENZA VACCINE  Completed   Hepatitis C Screening  Completed   HIV Screening  Completed   HPV VACCINES  Aged Out   PAP SMEAR-Modifier  Discontinued    HM Colonoscopy          Overdue - COLONOSCOPY (Pts 45-45yrs Insurance coverage will need to be confirmed) (Every 10 Years) Overdue - never done    No completion history exists for this topic.           Medications: Current Outpatient Medications on File Prior to Visit  Medication Sig Dispense Refill   clobetasol ointment (TEMOVATE) 0.10 % 1 application     RESTASIS 0.05 % ophthalmic emulsion Instill 1 drop  into affected eye(s) every 12 (twelve) hours 180 each 4   Saline Spray 0.2 % SOLN Place 1 spray into the nose 3 (three) times daily as needed.     Sodium Fluoride (SODIUM FLUORIDE 5000 PPM) 1.1 % PSTE use pea sized amount with each brushing. 100 mL PRN   Sodium Fluoride 1.1 % PSTE Use a pea sized amount with each brushing 100 mL PRN   Ascorbic Acid (VITAMIN C) 1000 MG tablet Take 2,000 mg by mouth daily. (Patient not taking: Reported on 01/29/2022)     Bromelains 500 MG TABS Take by mouth. (Patient not taking: Reported on 01/29/2022)     [place] asiatica, (GOTU KOLA PO)  Take by mouth. (Patient not taking: Reported on 01/29/2022)     Lifitegrast (XIIDRA) 5 % SOLN Place 1 drop into both eyes 2 (two) times daily. (Patient not taking: Reported on 01/29/2022) 180 each 4   magnesium gluconate (MAGONATE) 500 MG tablet Take 500 mg by mouth 2 (two) times daily. (Patient not taking: Reported on 01/29/2022)     Moringa Oleifera (MORINGA PO) Take by mouth. (Patient not taking: Reported on 01/29/2022)     vitamin E 180 MG (400 UNITS) capsule 1 capsule Orally Once a day (Patient not taking: Reported on 01/29/2022)     zinc gluconate 50 MG tablet Take 50 mg by mouth daily. (Patient not taking: Reported on 01/29/2022)     No current facility-administered  medications on file prior to visit.    Review of Systems: No headache, visual changes, nausea, vomiting, diarrhea, constipation, dizziness, abdominal pain, skin rash, fevers, chills, night sweats, swollen lymph nodes, weight loss, chest pain, body aches, joint swelling, muscle aches, shortness of breath, mood changes, visual or auditory hallucinations reported.  Objective:   Vitals:   01/29/22 1525 01/29/22 1634  BP: (!) 138/94 (!) 138/94  Pulse: 72   SpO2: 96%    Vitals:   01/29/22 1525  Weight: 204 lb (92.5 kg)  Height: 5\' 7"  (1.702 m)   Body mass index is 31.95 kg/m.  General: Well Developed, well nourished, and in no acute distress.  Neuro: Alert and oriented x3, extra-ocular muscles intact, sensation grossly intact. Cranial nerves II through XII are grossly intact, motor, sensory, and coordinative functions are intact. HEENT: Normocephalic, atraumatic, pupils equal round reactive to light, neck supple, no masses, no lymphadenopathy, thyroid nonpalpable. Oropharynx, nasopharynx, external ear canals are unremarkable. Skin: Warm and dry, no rashes noted.  Cardiac: Regular rate and rhythm, no murmurs rubs or gallops. No peripheral edema. Pulses symmetric. Respiratory: Clear to auscultation bilaterally. Not using accessory muscles, speaking in full sentences.  Abdominal: Soft, nontender, nondistended, positive bowel sounds, no masses, no organomegaly. Musculoskeletal: Shoulder, elbow, wrist, hip, knee, ankle stable, and with full range of motion.  Female chaperone initials: KP present throughout the physical examination.  Impression and Recommendations:   The patient was counselled, risk factors were discussed, and anticipatory guidance given.  Problem List Items Addressed This Visit       Other   Hyperlipidemia    Recheck of labs ordered today      Relevant Orders   Comprehensive metabolic panel   Lipid panel   Annual physical exam - Primary    Annual examination  completed, risk stratification labs ordered, anticipatory guidance provided.  We will follow labs once resulted.      Relevant Orders   CBC   Comprehensive metabolic panel   Lipid panel   TSH   VITAMIN D 25 Hydroxy (Vit-D Deficiency, Fractures)   Encounter for weight management    Patient has demonstrated continued weight loss and reports tolerance of Wegovy 2.4 mg, did discuss continued dosing until weight goals are achieved, working towards regular dietary and activity routine, and we will coordinate follow-up in 3 months.      Relevant Orders   TSH   Obesity, Class I, BMI 30-34.9   Relevant Medications   WEGOVY 2.4 MG/0.75ML SOAJ   Need for immunization against influenza    Annual influenza vaccination administered today      Relevant Orders   Flu Vaccine QUAD 97mo+IM (Fluarix, Fluzone & Alfiuria Quad PF) (Completed)   Other Visit Diagnoses  Low serum vitamin D       Relevant Orders   VITAMIN D 25 Hydroxy (Vit-D Deficiency, Fractures)        Orders & Medications Medications:  Meds ordered this encounter  Medications   WEGOVY 2.4 MG/0.75ML SOAJ    Sig: Inject 2.4 mg into the skin once a week.    Dispense:  9 mL    Refill:  2   Orders Placed This Encounter  Procedures   Flu Vaccine QUAD 32mo+IM (Fluarix, Fluzone & Alfiuria Quad PF)   CBC   Comprehensive metabolic panel   Lipid panel   TSH   VITAMIN D 25 Hydroxy (Vit-D Deficiency, Fractures)     Return in about 3 months (around 04/30/2022).    Montel Culver, MD   Primary Care Sports Medicine [place]

## 2022-01-29 NOTE — Assessment & Plan Note (Signed)
Annual examination completed, risk stratification labs ordered, anticipatory guidance provided.  We will follow labs once resulted. 

## 2022-02-25 DIAGNOSIS — H16223 Keratoconjunctivitis sicca, not specified as Sjogren's, bilateral: Secondary | ICD-10-CM | POA: Diagnosis not present

## 2022-03-09 ENCOUNTER — Telehealth: Payer: Self-pay | Admitting: Family Medicine

## 2022-03-09 ENCOUNTER — Encounter: Payer: Self-pay | Admitting: Family Medicine

## 2022-03-09 ENCOUNTER — Other Ambulatory Visit (HOSPITAL_COMMUNITY): Payer: Self-pay

## 2022-03-09 NOTE — Telephone Encounter (Signed)
Copied from [place] [id number]. Topic: General - Other >> Mar 09, 2022 10:43 AM Chapman Fitch wrote: Reason for CRM: Pt has been getting charged after every visit with Dr. Zigmund Daniel due to his tax ID showing up as a specialist and emergency medicine specialist instead of her primary care provider / she pays $25 copay in office and then receives a bill for additional $50 copay / total billing this year was an additional $270.11 / please advise if Dr. Rodman Key had a separate tax Id# or if this can be fixed asap

## 2022-03-12 ENCOUNTER — Other Ambulatory Visit (HOSPITAL_COMMUNITY): Payer: Self-pay

## 2022-03-16 ENCOUNTER — Other Ambulatory Visit (HOSPITAL_COMMUNITY): Payer: Self-pay

## 2022-03-17 ENCOUNTER — Other Ambulatory Visit: Payer: Self-pay

## 2022-03-17 DIAGNOSIS — Z1211 Encounter for screening for malignant neoplasm of colon: Secondary | ICD-10-CM

## 2022-03-25 ENCOUNTER — Ambulatory Visit: Payer: 59 | Admitting: Family Medicine

## 2022-03-27 DIAGNOSIS — H52223 Regular astigmatism, bilateral: Secondary | ICD-10-CM | POA: Diagnosis not present

## 2022-04-07 ENCOUNTER — Other Ambulatory Visit (HOSPITAL_COMMUNITY): Payer: Self-pay

## 2022-04-07 ENCOUNTER — Other Ambulatory Visit: Payer: Self-pay | Admitting: Family Medicine

## 2022-04-08 ENCOUNTER — Other Ambulatory Visit (HOSPITAL_COMMUNITY): Payer: Self-pay

## 2022-04-08 MED ORDER — CLOBETASOL PROPIONATE 0.05 % EX OINT
TOPICAL_OINTMENT | CUTANEOUS | 0 refills | Status: AC
Start: 2022-04-08 — End: ?
  Filled 2022-04-08: qty 30, 14d supply, fill #0

## 2022-04-08 NOTE — Telephone Encounter (Signed)
Please advise 

## 2022-04-16 DIAGNOSIS — Z0000 Encounter for general adult medical examination without abnormal findings: Secondary | ICD-10-CM | POA: Diagnosis not present

## 2022-04-16 DIAGNOSIS — Z7689 Persons encountering health services in other specified circumstances: Secondary | ICD-10-CM | POA: Diagnosis not present

## 2022-04-16 DIAGNOSIS — R7989 Other specified abnormal findings of blood chemistry: Secondary | ICD-10-CM | POA: Diagnosis not present

## 2022-04-16 DIAGNOSIS — E785 Hyperlipidemia, unspecified: Secondary | ICD-10-CM | POA: Diagnosis not present

## 2022-04-17 LAB — CBC
Hematocrit: 45.5 % (ref 34.0–46.6)
Hemoglobin: 15.4 g/dL (ref 11.1–15.9)
MCH: 27.9 pg (ref 26.6–33.0)
MCHC: 33.8 g/dL (ref 31.5–35.7)
MCV: 82 fL (ref 79–97)
Platelets: 343 10*3/uL (ref 150–450)
RBC: 5.52 x10E6/uL — ABNORMAL HIGH (ref 3.77–5.28)
RDW: 12.7 % (ref 11.7–15.4)
WBC: 4.6 10*3/uL (ref 3.4–10.8)

## 2022-04-17 LAB — LIPID PANEL
Chol/HDL Ratio: 3.3 ratio (ref 0.0–4.4)
Cholesterol, Total: 207 mg/dL — ABNORMAL HIGH (ref 100–199)
HDL: 63 mg/dL (ref >39)
LDL Chol Calc (NIH): 132 mg/dL — ABNORMAL HIGH (ref 0–99)
Triglycerides: 69 mg/dL (ref 0–149)
VLDL Cholesterol Cal: 12 mg/dL (ref 5–40)

## 2022-04-17 LAB — COMPREHENSIVE METABOLIC PANEL
ALT: 19 IU/L (ref 0–32)
AST: 24 IU/L (ref 0–40)
Albumin/Globulin Ratio: 1.8 (ref 1.2–2.2)
Albumin: 4.6 g/dL (ref 3.9–4.9)
Alkaline Phosphatase: 99 IU/L (ref 44–121)
BUN/Creatinine Ratio: 11 (ref 9–23)
BUN: 9 mg/dL (ref 6–24)
Bilirubin Total: 0.6 mg/dL (ref 0.0–1.2)
CO2: 24 mmol/L (ref 20–29)
Calcium: 10 mg/dL (ref 8.7–10.2)
Chloride: 101 mmol/L (ref 96–106)
Creatinine, Ser: 0.85 mg/dL (ref 0.57–1.00)
Globulin, Total: 2.6 g/dL (ref 1.5–4.5)
Glucose: 80 mg/dL (ref 70–99)
Potassium: 4.5 mmol/L (ref 3.5–5.2)
Sodium: 139 mmol/L (ref 134–144)
Total Protein: 7.2 g/dL (ref 6.0–8.5)
eGFR: 84 mL/min/{1.73_m2} (ref >59)

## 2022-04-17 LAB — TSH: TSH: 1.22 u[IU]/mL (ref 0.450–4.500)

## 2022-04-17 LAB — VITAMIN D 25 HYDROXY (VIT D DEFICIENCY, FRACTURES): Vit D, 25-Hydroxy: 20.9 ng/mL — ABNORMAL LOW (ref 30.0–100.0)

## 2022-04-20 ENCOUNTER — Other Ambulatory Visit (HOSPITAL_COMMUNITY): Payer: Self-pay

## 2022-04-20 ENCOUNTER — Other Ambulatory Visit: Payer: Self-pay | Admitting: Family Medicine

## 2022-04-20 MED ORDER — VITAMIN D (ERGOCALCIFEROL) 1.25 MG (50000 UNIT) PO CAPS
50000.0000 [IU] | ORAL_CAPSULE | ORAL | 0 refills | Status: DC
Start: 2022-04-20 — End: 2023-03-10
  Filled 2022-04-20: qty 8, 56d supply, fill #0

## 2022-04-27 ENCOUNTER — Other Ambulatory Visit (HOSPITAL_COMMUNITY): Payer: Self-pay

## 2022-05-04 ENCOUNTER — Ambulatory Visit: Payer: 59 | Admitting: Family Medicine

## 2022-05-05 ENCOUNTER — Encounter: Payer: Self-pay | Admitting: Family Medicine

## 2022-05-05 ENCOUNTER — Other Ambulatory Visit (HOSPITAL_COMMUNITY): Payer: Self-pay

## 2022-05-05 ENCOUNTER — Ambulatory Visit: Payer: 59 | Admitting: Family Medicine

## 2022-05-05 VITALS — BP 118/78 | HR 84 | Ht 67.0 in | Wt 203.0 lb

## 2022-05-05 DIAGNOSIS — E669 Obesity, unspecified: Secondary | ICD-10-CM | POA: Diagnosis not present

## 2022-05-05 DIAGNOSIS — Z7689 Persons encountering health services in other specified circumstances: Secondary | ICD-10-CM

## 2022-05-05 MED ORDER — WEGOVY 2.4 MG/0.75ML ~~LOC~~ SOAJ
2.4000 mg | SUBCUTANEOUS | 0 refills | Status: DC
  Filled 2022-05-05: qty 9, 84d supply, fill #0
  Filled 2022-07-05 – 2022-07-09 (×2): qty 3, 28d supply, fill #0

## 2022-05-16 NOTE — Progress Notes (Signed)
     Primary Care / Sports Medicine Office Visit  Patient Information:  Patient ID: Brooke Graves, female DOB: 27-Mar-1976 Age: 47 y.o. MRN: [id number]   Brooke Graves is a pleasant 47 y.o. female presenting with the following:  Chief Complaint  Patient presents with   Weight Check    Tolerating medicaiton well, did feel like she was stuck at weight for a little bit.     Vitals:   05/05/22 1043  BP: 118/78  Pulse: 84  SpO2: 96%   Vitals:   05/05/22 1043  Weight: 203 lb (92.1 kg)  Height: '5\' 7"'$  (1.702 m)   Body mass index is 31.79 kg/m.  No results found.   Independent interpretation of notes and tests performed by another provider:   None  Procedures performed:   None  Pertinent History, Exam, Impression, and Recommendations:   Brooke Graves was seen today for weight check.  Encounter for weight management Overview:    05/05/2022   10:43 AM 01/29/2022    4:34 PM 01/29/2022    3:25 PM  Vitals with BMI  Height '5\' 7"'$   '5\' 7"'$   Weight 203 lbs  204 lbs  BMI 42.87  68.11  Systolic 572 620 355  Diastolic 78 94 94  Pulse 84  72     Assessment & Plan: Working towards goals, discussed Rx management, will continue to work on lifestyle modifications as adjunct.   Obesity, Class I, BMI 30-34.9 -     HRCBUL; Inject 2.4 mg into the skin once a week.  Dispense: 9 mL; Refill: 0     Orders & Medications Meds ordered this encounter  Medications   WEGOVY 2.4 MG/0.75ML SOAJ    Sig: Inject 2.4 mg into the skin once a week.    Dispense:  9 mL    Refill:  0   No orders of the defined types were placed in this encounter.    Return in about 3 months (around 08/04/2022) for weight check.     Brooke Culver, MD, [facility]   Primary Care Sports Medicine Primary Care and Sports Medicine at [facility]

## 2022-05-16 NOTE — Assessment & Plan Note (Signed)
Working towards goals, discussed Rx management, will continue to work on lifestyle modifications as adjunct.

## 2022-07-05 ENCOUNTER — Other Ambulatory Visit (HOSPITAL_COMMUNITY): Payer: Self-pay

## 2022-07-09 ENCOUNTER — Other Ambulatory Visit (HOSPITAL_COMMUNITY): Payer: Self-pay

## 2022-07-28 ENCOUNTER — Encounter: Payer: Self-pay | Admitting: Family Medicine

## 2022-08-06 ENCOUNTER — Ambulatory Visit: Payer: Commercial Managed Care - PPO | Admitting: Family Medicine

## 2022-08-17 ENCOUNTER — Encounter: Payer: Self-pay | Admitting: Family Medicine

## 2022-08-17 ENCOUNTER — Other Ambulatory Visit: Payer: Self-pay | Admitting: Family Medicine

## 2022-08-17 NOTE — Telephone Encounter (Signed)
Requested medications are due for refill today.  unsure  Requested medications are on the active medications list.  yes  Last refill. 04/20/2022 #8 0 rf  Future visit scheduled.   no  Notes to clinic.  Refill not delegated.    Requested Prescriptions  Pending Prescriptions Disp Refills   Vitamin D, Ergocalciferol, (DRISDOL) 1.25 MG (50000 UNIT) CAPS capsule 8 capsule 0    Sig: Take 1 capsule (50,000 Units total) by mouth every 7 (seven) days. Take for 8 total doses (weeks).     Endocrinology:  Vitamins - Vitamin D Supplementation 2 Failed - 08/17/2022 12:18 AM      Failed - Manual Review: Route requests for 50,000 IU strength to the provider      Failed - Vitamin D in normal range and within 360 days    Vit D, 25-Hydroxy  Date Value Ref Range Status  04/16/2022 20.9 (L) 30.0 - 100.0 ng/mL Final    Comment:    Vitamin D deficiency has been defined by the Institute of Medicine and an Endocrine Society practice guideline as a level of serum 25-OH vitamin D less than 20 ng/mL (1,2). The Endocrine Society went on to further define vitamin D insufficiency as a level between 21 and 29 ng/mL (2). 1. IOM (Institute of Medicine). 2010. Dietary reference    intakes for calcium and D. [place]: The    [employer]. 2. Holick MF, Binkley [place], Bischoff-Ferrari HA, et al.    Evaluation, treatment, and prevention of vitamin D    deficiency: an Endocrine Society clinical practice    guideline. JCEM. 2011 Jul; 96(7):1911-30.          Passed - Ca in normal range and within 360 days    Calcium  Date Value Ref Range Status  04/16/2022 10.0 8.7 - 10.2 mg/dL Final         Passed - Valid encounter within last 12 months    Recent Outpatient Visits           3 months ago Encounter for weight management   [place] [facility] at [facility], Ocie Bob, MD   6 months ago Annual physical exam   [facility] at  [facility], Ocie Bob, MD   8 months ago Obesity, Class I, BMI 30-34.9   [place] [facility] at [facility], Ocie Bob, MD   9 months ago Obesity, Class I, BMI 30-34.9   [place] [facility] at [facility], Ocie Bob, MD   10 months ago Chronic left-sided low back pain with left-sided sciatica   [facility] at [facility], Ocie Bob, MD

## 2022-08-17 NOTE — Telephone Encounter (Signed)
Please advise 

## 2022-08-18 ENCOUNTER — Other Ambulatory Visit (HOSPITAL_COMMUNITY): Payer: Self-pay

## 2022-09-03 ENCOUNTER — Encounter: Payer: Self-pay | Admitting: Family Medicine

## 2022-09-06 NOTE — Telephone Encounter (Signed)
Can you help with this?

## 2022-09-09 ENCOUNTER — Encounter: Payer: Self-pay | Admitting: Family Medicine

## 2022-09-09 ENCOUNTER — Other Ambulatory Visit (HOSPITAL_COMMUNITY): Payer: Self-pay

## 2022-09-09 MED ORDER — PEG 3350-KCL-NACL-NASULF-MGSUL 178.7 G PO SOLR
ORAL | 0 refills | Status: DC
  Filled 2022-09-09: qty 2, 1d supply, fill #0

## 2022-09-10 ENCOUNTER — Other Ambulatory Visit: Payer: Self-pay

## 2022-09-10 ENCOUNTER — Other Ambulatory Visit (HOSPITAL_COMMUNITY): Payer: Self-pay

## 2022-09-10 DIAGNOSIS — Z1211 Encounter for screening for malignant neoplasm of colon: Secondary | ICD-10-CM

## 2022-09-13 ENCOUNTER — Other Ambulatory Visit (HOSPITAL_COMMUNITY): Payer: Self-pay

## 2022-09-15 ENCOUNTER — Other Ambulatory Visit (HOSPITAL_COMMUNITY): Payer: Self-pay

## 2022-10-11 ENCOUNTER — Ambulatory Visit: Payer: Commercial Managed Care - PPO | Admitting: Podiatry

## 2022-10-19 ENCOUNTER — Other Ambulatory Visit (HOSPITAL_COMMUNITY): Payer: Self-pay

## 2022-10-19 ENCOUNTER — Ambulatory Visit: Payer: Commercial Managed Care - PPO | Admitting: Podiatry

## 2022-10-19 DIAGNOSIS — B070 Plantar wart: Secondary | ICD-10-CM | POA: Diagnosis not present

## 2022-10-19 DIAGNOSIS — M722 Plantar fascial fibromatosis: Secondary | ICD-10-CM

## 2022-10-19 MED ORDER — FLUOROURACIL 5 % EX CREA
1.0000 | TOPICAL_CREAM | Freq: Two times a day (BID) | CUTANEOUS | 0 refills | Status: DC
Start: 2022-10-19 — End: 2023-03-10
  Filled 2022-10-19: qty 40, 30d supply, fill #0

## 2022-10-19 NOTE — Progress Notes (Signed)
Subjective:  Patient ID: Brooke Graves, female    DOB: December 07, 1975,  MRN: [id number] HPI Chief Complaint  Patient presents with   Skin Problem    Sub 1st MPJ right - callused lesion x 1 month, tried OTC wart meds-some better   New Patient (Initial Visit)   Foot Orthotics    Interested in getting new orthotics    47 y.o. female presents with the above complaint.   ROS: Denies fever chills nausea vomit muscle aches and pains.  Past Medical History:  Diagnosis Date   Acquired absence of both breasts 05/08/2015   Allergy    BRCA positive    BRCA2 gene mutation positive 12/26/2018   Hyperlipidemia 02/06/2021   Iron deficiency anemia 06/11/2014   Plantar fasciitis    bilateral   Poisoning, snake bite 11/2019   [place]   Psoriasis    Tick bite of ankle    right   Past Surgical History:  Procedure Laterality Date   ABDOMINAL HYSTERECTOMY  06/2014   ABDOMINOPLASTY  2006   BREAST ENHANCEMENT SURGERY Bilateral 03/2015   BREAST SURGERY Bilateral 12/18/2020   Revision   MASTECTOMY Bilateral 03/2015   ROTATOR CUFF REPAIR Right 02/2017   WISDOM TOOTH EXTRACTION Bilateral     Current Outpatient Medications:    fluorouracil (EFUDEX) 5 % cream, Apply 1 Application topically 2 (two) times daily., Disp: 40 g, Rfl: 0   clobetasol ointment (TEMOVATE) 0.05 %, Apply twice daily for up to 2 weeks, Disp: 30 g, Rfl: 0   PEG 3350-KCl-NaCl-NaSulf-MgSul 178.7 g SOLR, Use as directed per prep sheet, Disp: 2 each, Rfl: 0   RESTASIS 0.05 % ophthalmic emulsion, Instill 1 drop  into affected eye(s) every 12 (twelve) hours, Disp: 180 each, Rfl: 4   Saline Spray 0.2 % SOLN, Place 1 spray into the nose 3 (three) times daily as needed., Disp: , Rfl:    Sodium Fluoride 1.1 % PSTE, Use a pea sized amount with each brushing, Disp: 100 mL, Rfl: PRN   Vitamin D, Ergocalciferol, (DRISDOL) 1.25 MG (50000 UNIT) CAPS capsule, Take 1 capsule (50,000 Units total) by mouth every 7 (seven) days. Take  for 8 total doses (weeks)., Disp: 8 capsule, Rfl: 0   WEGOVY 2.4 MG/0.75ML SOAJ, Inject 2.4 mg into the skin once a week., Disp: 9 mL, Rfl: 0   zinc gluconate 50 MG tablet, Take 50 mg by mouth daily., Disp: , Rfl:   Allergies  Allergen Reactions   Sulfamethoxazole-Trimethoprim Rash and Palpitations   Other Other (See Comments)   Pork Allergy Itching   Lactase-Lactobacillus Other (See Comments)    GI INTOLERANCE   Pork-Derived Products Other (See Comments)    GI INTOLERANCE   Tape Rash   Wound Dressing Adhesive Rash   Review of Systems Objective:  There were no vitals filed for this visit.  General: Well developed, nourished, in no acute distress, alert and oriented x3   Dermatological: Skin is warm, dry and supple bilateral. Nails x 10 are well maintained; remaining integument appears unremarkable at this time. There are no open sores, no preulcerative lesions, no rash or signs of infection present.  2 small verrucoid lesion subfirst metatarsal right foot demonstrates very superficial lesions almost resolved completely by her at home treatment.  Vascular: Dorsalis Pedis artery and Posterior Tibial artery pedal pulses are 2/4 bilateral with immedate capillary fill time. Pedal hair growth present. No varicosities and no lower extremity edema present bilateral.   Neruologic: Grossly intact  via light touch bilateral. Vibratory intact via tuning fork bilateral. Protective threshold with Semmes Wienstein monofilament intact to all pedal sites bilateral. Patellar and Achilles deep tendon reflexes 2+ bilateral. No Babinski or clonus noted bilateral.   Musculoskeletal: No gross boney pedal deformities bilateral. No pain, crepitus, or limitation noted with foot and ankle range of motion bilateral. Muscular strength 5/5 in all groups tested bilateral.  Planter fasciitis history.  Gait: Unassisted, Nonantalgic.    Radiographs:  None taken  Assessment & Plan:   Assessment: Verruca plantaris  right.  History of Planter fasciitis.  Plan: Recommended that she continue current therapies at home.  I did write her prescription for Efudex cream so she should she decide to use it.     Christinamarie Tall T. [place], [place]

## 2022-10-25 ENCOUNTER — Other Ambulatory Visit (HOSPITAL_COMMUNITY): Payer: Self-pay

## 2022-10-28 ENCOUNTER — Other Ambulatory Visit: Payer: Self-pay

## 2022-10-28 ENCOUNTER — Telehealth: Payer: Self-pay | Admitting: Family Medicine

## 2022-10-28 ENCOUNTER — Encounter: Payer: Self-pay | Admitting: Family Medicine

## 2022-10-28 NOTE — Telephone Encounter (Signed)
Called pt scheduled appt for tomorrow 10/29/22.  KP

## 2022-10-28 NOTE — Telephone Encounter (Signed)
Copied from CRM [id number]. Topic: Appointment Scheduling - Scheduling Inquiry for Clinic >> Oct 28, 2022  2:07 PM [place] B wrote: Reason for CRM: pt called in request immunications,titer/ tdcap Jeannetta Ellis Delsa Sale

## 2022-10-29 ENCOUNTER — Ambulatory Visit (INDEPENDENT_AMBULATORY_CARE_PROVIDER_SITE_OTHER): Payer: Commercial Managed Care - PPO

## 2022-10-29 ENCOUNTER — Other Ambulatory Visit: Payer: Self-pay

## 2022-10-29 DIAGNOSIS — Z789 Other specified health status: Secondary | ICD-10-CM | POA: Diagnosis not present

## 2022-10-29 DIAGNOSIS — Z23 Encounter for immunization: Secondary | ICD-10-CM | POA: Diagnosis not present

## 2022-10-29 DIAGNOSIS — Z1211 Encounter for screening for malignant neoplasm of colon: Secondary | ICD-10-CM

## 2022-10-30 LAB — MEASLES/MUMPS/RUBELLA IMMUNITY
MUMPS ABS, IGG: <9 AU/mL — ABNORMAL LOW (ref >10.9)
RUBEOLA AB, IGG: >300 AU/mL (ref >16.4)
Rubella Antibodies, IGG: 5.99 index (ref >0.99)

## 2022-11-02 ENCOUNTER — Other Ambulatory Visit: Payer: Self-pay

## 2022-11-04 DIAGNOSIS — Z1211 Encounter for screening for malignant neoplasm of colon: Secondary | ICD-10-CM | POA: Diagnosis not present

## 2022-11-18 LAB — COLOGUARD: COLOGUARD: NEGATIVE

## 2022-12-13 IMAGING — CR DG LUMBAR SPINE COMPLETE 4+V
5 series · 5 of 5 positions shown · non-contrast
Comparison: None.

CLINICAL DATA: Back pain

EXAM:
PELVIS - 1-2 VIEW; LUMBAR SPINE - COMPLETE 4+ VIEW

[w lumbar spine ap]
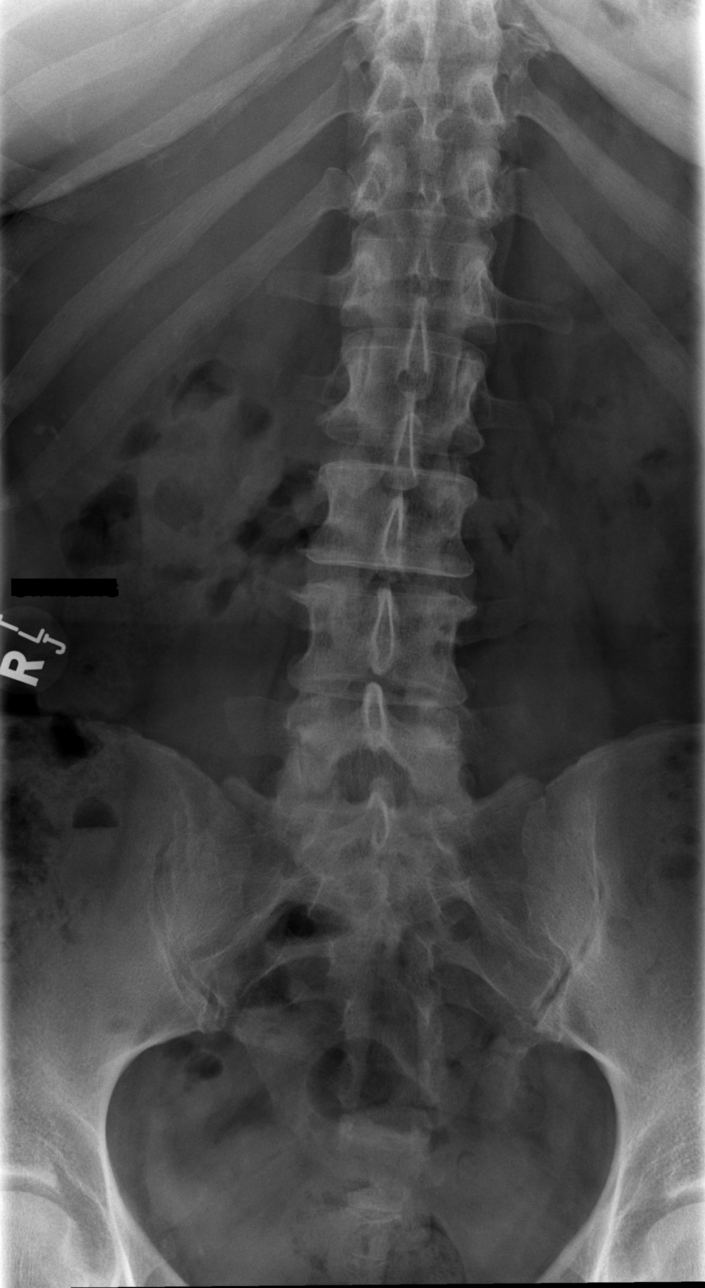

[w lumbar spine obl (1 of 2)]
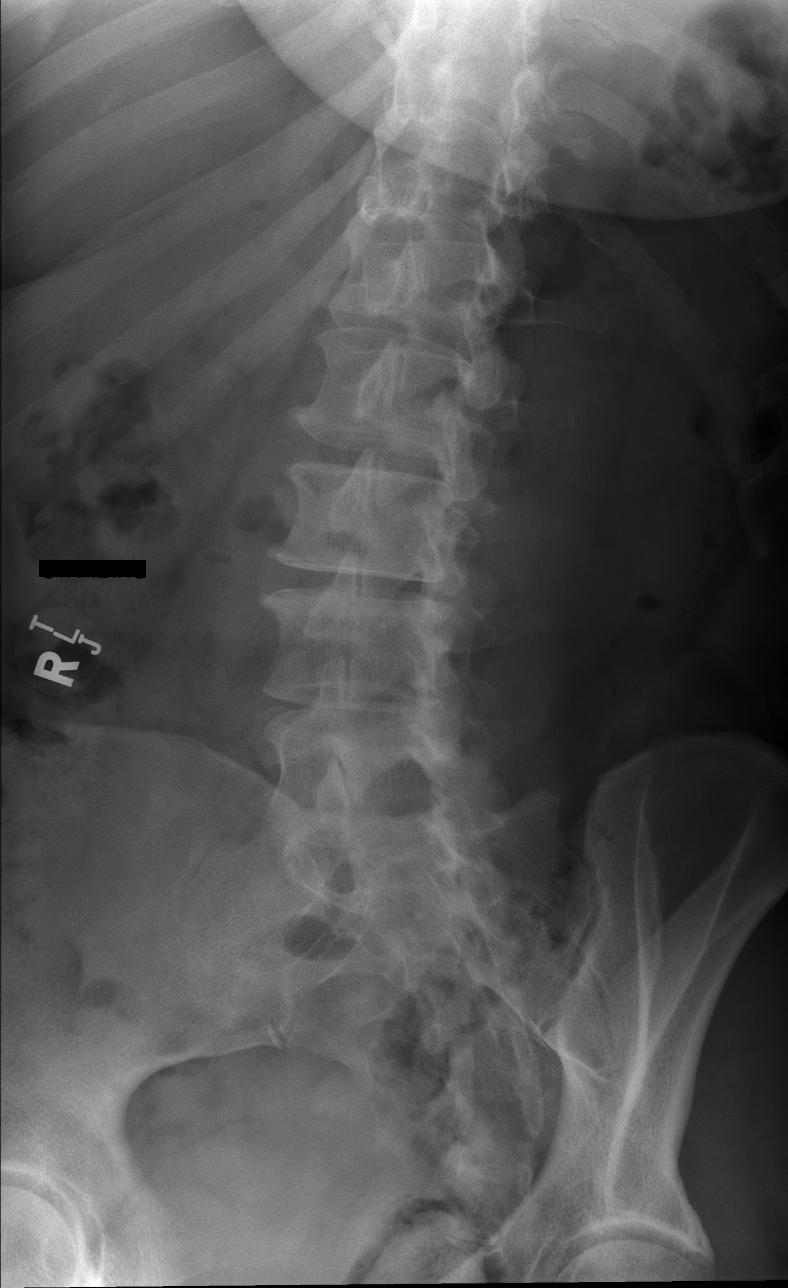

[w lumbar spine obl (2 of 2)]
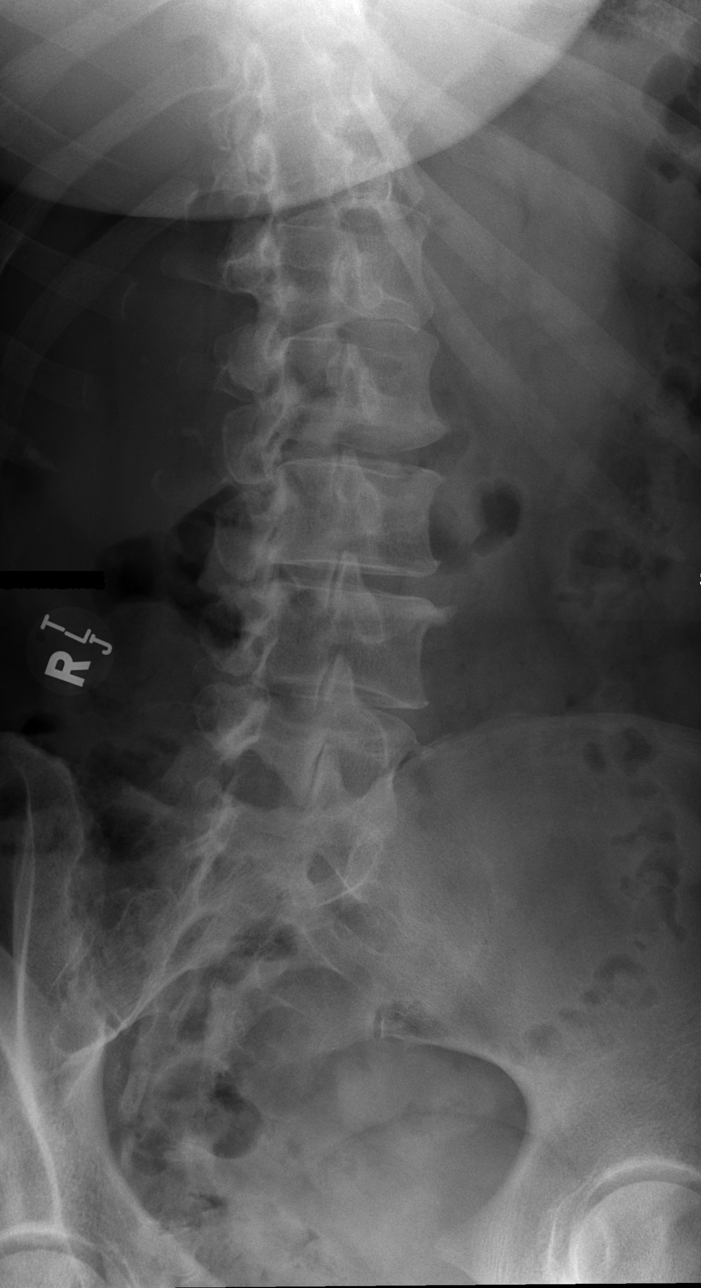

[w lumbar spine lat]
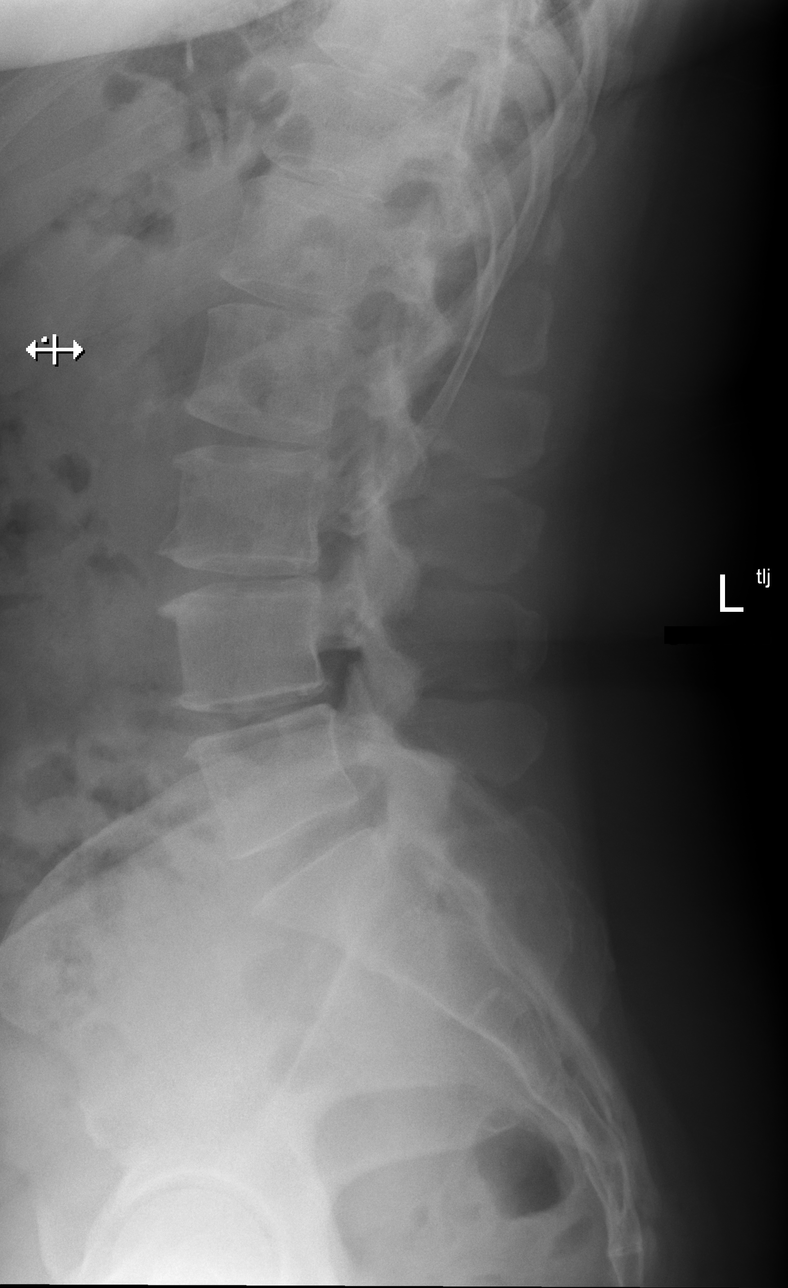

[w lumbar l-5 s-1 spot]
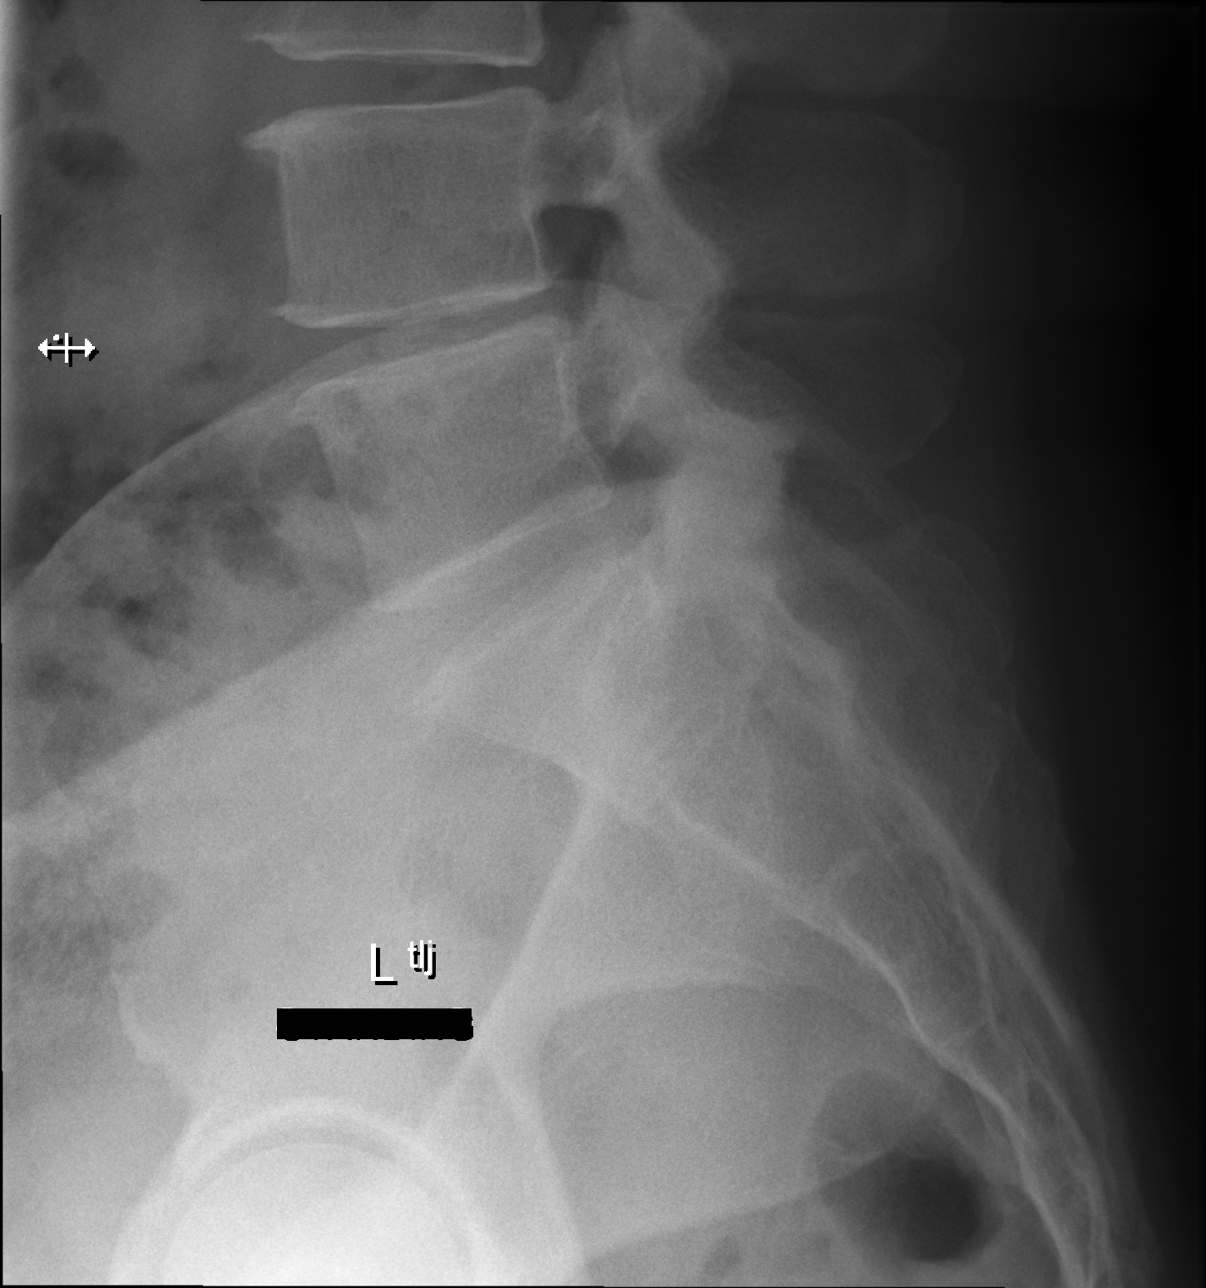

[5 of 5 positions shown; findings below may reference images not displayed]

FINDINGS: There is no evidence of pelvic fracture or diastasis. No pelvic bone
lesions are seen.

There is no evidence lumbar spine fracture. 5 non-rib-bearing lumbar
type vertebral bodies. Mild multilevel degenerative disc disease
consisting of osteophyte formation. Disc spaces are relatively well
maintained. Mild facet arthropathy of the lower lumbar spine.
Calcifications of the right upper quadrant.
IMPRESSION: 1. No acute osseous abnormality.
2. Right upper quadrant calcifications which may be due to
nephrolithiasis or cholelithiasis. Right upper quadrant ultrasound
could be performed for further evaluation.

## 2022-12-13 IMAGING — CR DG PELVIS 1-2V
1 series · 1 of 1 positions shown · non-contrast
Comparison: None.

CLINICAL DATA: Back pain

EXAM:
PELVIS - 1-2 VIEW; LUMBAR SPINE - COMPLETE 4+ VIEW

[w pelvis upright]
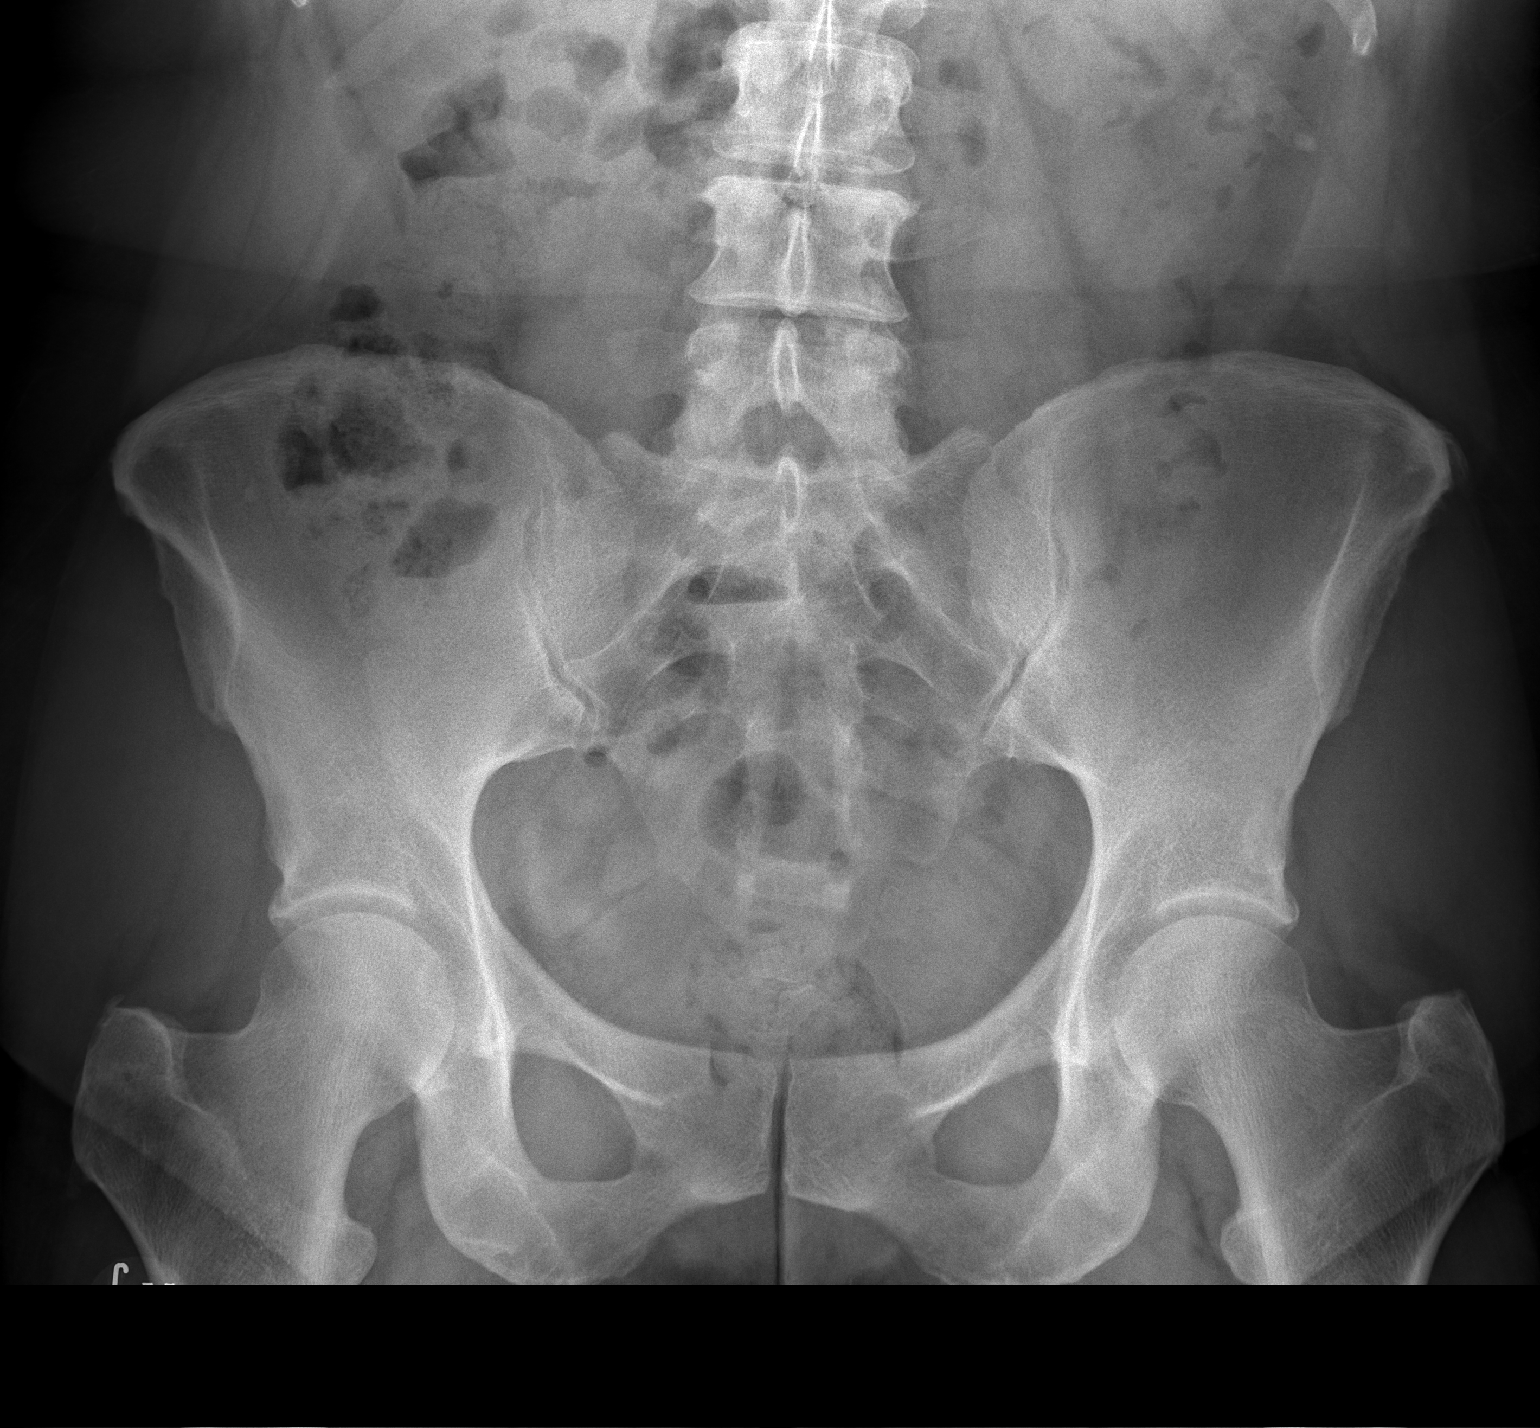

[1 of 1 positions shown; findings below may reference images not displayed]

FINDINGS: There is no evidence of pelvic fracture or diastasis. No pelvic bone
lesions are seen.

There is no evidence lumbar spine fracture. 5 non-rib-bearing lumbar
type vertebral bodies. Mild multilevel degenerative disc disease
consisting of osteophyte formation. Disc spaces are relatively well
maintained. Mild facet arthropathy of the lower lumbar spine.
Calcifications of the right upper quadrant.
IMPRESSION: 1. No acute osseous abnormality.
2. Right upper quadrant calcifications which may be due to
nephrolithiasis or cholelithiasis. Right upper quadrant ultrasound
could be performed for further evaluation.

## 2022-12-23 DIAGNOSIS — Z23 Encounter for immunization: Secondary | ICD-10-CM | POA: Diagnosis not present

## 2023-03-10 ENCOUNTER — Ambulatory Visit: Payer: BC Managed Care – PPO | Admitting: Family Medicine

## 2023-03-10 ENCOUNTER — Ambulatory Visit: Payer: Self-pay | Admitting: *Deleted

## 2023-03-10 ENCOUNTER — Encounter: Payer: Self-pay | Admitting: Family Medicine

## 2023-03-10 VITALS — BP 138/88 | HR 64 | Ht 67.0 in | Wt 214.0 lb

## 2023-03-10 DIAGNOSIS — Z23 Encounter for immunization: Secondary | ICD-10-CM | POA: Diagnosis not present

## 2023-03-10 DIAGNOSIS — R002 Palpitations: Secondary | ICD-10-CM | POA: Diagnosis not present

## 2023-03-10 DIAGNOSIS — R413 Other amnesia: Secondary | ICD-10-CM | POA: Diagnosis not present

## 2023-03-10 MED ORDER — HYDROXYZINE PAMOATE 25 MG PO CAPS: 25.0000 mg | ORAL_CAPSULE | Freq: Three times a day (TID) | ORAL | 1 refills | Status: AC | PRN

## 2023-03-10 NOTE — Telephone Encounter (Signed)
Reason for Disposition  [1] Skipped or extra beat(s) AND [2] occurs 4 or more times per minute  Answer Assessment - Initial Assessment Questions 1. DESCRIPTION: "Please describe your heart rate or heartbeat that you are having" (e.g., fast/slow, regular/irregular, skipped or extra beats, "palpitations")     Heart racing- goes "super quick" 2. ONSET: "When did it start?" (Minutes, hours or days)      Today when called- but patient reports she has had this  for 2 months- comes and goes- she started school 2 months ago- noticed then 3. DURATION: "How long does it last" (e.g., seconds, minutes, hours)     Less than 2 minutes 4. PATTERN "Does it come and go, or has it been constant since it started?"  "Does it get worse with exertion?"   "Are you feeling it now?"     Comes and goes, happens if sitting or moving, normal now  6. HEART RATE: "Can you tell me your heart rate?" "How many beats in 15 seconds?"  (Note: not all patients can do this)       P 105- now down to 68 7. RECURRENT SYMPTOM: "Have you ever had this before?" If Yes, ask: "When was the last time?" and "What happened that time?"      No- random fluttering- one time in 6 months- patient has mentioned to previous provider- but not evaluated  8. CAUSE: "What do you think is causing the palpitations?"     unsure 9. CARDIAC HISTORY: "Do you have any history of heart disease?" (e.g., heart attack, angina, bypass surgery, angioplasty, arrhythmia)      no 10. OTHER SYMPTOMS: "Do you have any other symptoms?" (e.g., dizziness, chest pain, sweating, difficulty breathing)       no  Protocols used: Heart Rate and Heartbeat Questions-A-AH

## 2023-03-10 NOTE — Patient Instructions (Addendum)
-   Order for ZIO monitor and echocardiogram placed - Referral to cardiology placed - Can trial as needed hydroxyzine, 1-2 capsules every 8 hours as needed, side effect and be drowsiness

## 2023-03-10 NOTE — Progress Notes (Signed)
     Primary Care / Sports Medicine Office Visit  Patient Information:  Patient ID: Brooke Graves, female DOB: [birth year] Age: 47 y.o. MRN: [id number]   Brooke Graves Vikki Ports is a pleasant 47 y.o. female presenting with the following:  Chief Complaint  Patient presents with   Tachycardia    X4 months,Irregular, doesn't feel it right now, feels the beat in her ears, does not have a cardiologist, no chest pain, feels weird, comes and goes,    Memory Loss    Memory is getting bad, having an appt to see if she should be tested for ADHD    Vitals:   03/10/23 1611  BP: 138/88  Pulse: 64  SpO2: 98%   Vitals:   03/10/23 1611  Weight: 214 lb (97.1 kg)  Height: 5\' 7"  (1.702 m)   Body mass index is 33.52 kg/m.  No results found.   Independent interpretation of notes and tests performed by another provider:   None  Procedures performed:   None  Pertinent History, Exam, Impression, and Recommendations:   Problem List Items Addressed This Visit       Other   Palpitations - Primary    Acute on chronic, initially noted upon starting higher education program, attribute this to sleep and decreased intake, did note modest improvement when addressing these issues with significant recurrence over the past several months, denies any overt chest pain, nonexertional.  Does have a strong family history of cardiac conduction abnormalities.  We discussed various etiologies including her comorbid concerns and family history.  Plan: - Order for ZIO monitor and echocardiogram placed - Referral to cardiology placed - Can trial as needed hydroxyzine, 1-2 capsules every 8 hours as needed, side effect and be drowsiness      Relevant Orders   LONG TERM MONITOR (3-14 DAYS)   Ambulatory referral to Cardiology   ECHOCARDIOGRAM COMPLETE   Memory loss    Ongoing, patient has had COVID in the past, concomitant concern for ADHD with upcoming evaluation by psychiatry next week.  Prior  labs reviewed and are reassuring, we discussed varying etiologies and will follow peripherally after her evaluation by psychiatry.      Other Visit Diagnoses     Need for COVID-19 vaccine       Relevant Orders   Pfizer Comirnaty Covid -19 Vaccine 63yrs and older (Completed)   Need for influenza vaccination       Relevant Orders   Flu vaccine trivalent PF, 6mos and older(Flulaval,Afluria,Fluarix,Fluzone) (Completed)        Orders & Medications Medications:  Meds ordered this encounter  Medications   hydrOXYzine (VISTARIL) 25 MG capsule    Sig: Take 1-2 capsules (25-50 mg total) by mouth every 8 (eight) hours as needed for anxiety.    Dispense:  30 capsule    Refill:  1   Orders Placed This Encounter  Procedures   Flu vaccine trivalent PF, 6mos and older(Flulaval,Afluria,Fluarix,Fluzone)   Pfizer Comirnaty Covid -19 Vaccine 32yrs and older   Ambulatory referral to Cardiology   LONG TERM MONITOR (3-14 DAYS)   ECHOCARDIOGRAM COMPLETE     No follow-ups on file.     Jerrol Banana, MD, [facility]   Primary Care Sports Medicine Primary Care and Sports Medicine at [facility]

## 2023-03-10 NOTE — Telephone Encounter (Signed)
  Chief Complaint: irregular heart rate-fast heart rate Symptoms: patient states she has had fast heart rate for 2 months- comes and goes lasting  about 2 minutes Frequency: off/on- 2 months Pertinent Negatives: Patient denies dizziness, chest pain, sweating, difficulty breathing Disposition: [] ED /[] Urgent Care (no appt availability in office) / [x] Appointment(In office/virtual)/ []  [facility]/ [] Home Care/ [] Refused Recommended Disposition /[] [facility]/ []  Follow-up with PCP Additional Notes: Patient has been scheduled for evaluation of her symptoms in office- she states her symptoms could be related to her anxiety, but it is not getting better. Patient advised if she should get worse before her appointment - go to ED

## 2023-03-11 ENCOUNTER — Encounter: Payer: Self-pay | Admitting: Family Medicine

## 2023-03-11 ENCOUNTER — Ambulatory Visit: Payer: Self-pay | Attending: Family Medicine

## 2023-03-11 ENCOUNTER — Telehealth: Payer: Self-pay | Admitting: Family Medicine

## 2023-03-11 DIAGNOSIS — R002 Palpitations: Secondary | ICD-10-CM | POA: Insufficient documentation

## 2023-03-11 DIAGNOSIS — R413 Other amnesia: Secondary | ICD-10-CM | POA: Insufficient documentation

## 2023-03-11 NOTE — Assessment & Plan Note (Addendum)
Acute on chronic, initially noted upon starting higher education program, attribute this to sleep and decreased intake, did note modest improvement when addressing these issues with significant recurrence over the past several months, denies any overt chest pain, nonexertional.  Does have a strong family history of cardiac conduction abnormalities.  We discussed various etiologies including her comorbid concerns and family history.  Plan: - Order for ZIO monitor and echocardiogram placed - Referral to cardiology placed - Can trial as needed hydroxyzine, 1-2 capsules every 8 hours as needed, side effect and be drowsiness

## 2023-03-11 NOTE — Telephone Encounter (Signed)
Referral Request - Has patient seen PCP for this complaint? Yes  *If NO, is insurance requiring patient see PCP for this issue before PCP can refer them? ---  Referral for which specialty: Cardiology  Preferred provider/office:  [facility] Cardiology [address] [place] [address] PROVIDER: Jane Canary FAX # [phone]  Reason for referral: palpations, saw Dr Ashley Royalty for this yesterday 10.31.2024   Patient's callback # [phone]

## 2023-03-11 NOTE — Assessment & Plan Note (Signed)
Ongoing, patient has had COVID in the past, concomitant concern for ADHD with upcoming evaluation by psychiatry next week.  Prior labs reviewed and are reassuring, we discussed varying etiologies and will follow peripherally after her evaluation by psychiatry.

## 2023-03-11 NOTE — Telephone Encounter (Signed)
Please review. Can referral be placed?  KP

## 2023-03-16 DIAGNOSIS — F419 Anxiety disorder, unspecified: Secondary | ICD-10-CM | POA: Diagnosis not present

## 2023-03-21 ENCOUNTER — Encounter: Payer: Self-pay | Admitting: Family Medicine

## 2023-03-21 NOTE — Telephone Encounter (Signed)
FYI  KP

## 2023-03-26 DIAGNOSIS — R002 Palpitations: Secondary | ICD-10-CM | POA: Diagnosis not present

## 2023-04-19 DIAGNOSIS — F419 Anxiety disorder, unspecified: Secondary | ICD-10-CM | POA: Diagnosis not present

## 2023-05-31 DIAGNOSIS — F419 Anxiety disorder, unspecified: Secondary | ICD-10-CM | POA: Diagnosis not present

## 2023-06-28 DIAGNOSIS — F419 Anxiety disorder, unspecified: Secondary | ICD-10-CM | POA: Diagnosis not present

## 2023-08-09 DIAGNOSIS — F419 Anxiety disorder, unspecified: Secondary | ICD-10-CM | POA: Diagnosis not present

## 2023-08-15 ENCOUNTER — Encounter: Payer: Self-pay | Admitting: Family Medicine

## 2023-08-15 NOTE — Telephone Encounter (Signed)
 Please review. Appt?  KP

## 2023-08-16 ENCOUNTER — Telehealth (INDEPENDENT_AMBULATORY_CARE_PROVIDER_SITE_OTHER): Payer: Self-pay | Admitting: Family Medicine

## 2023-08-16 ENCOUNTER — Encounter: Payer: Self-pay | Admitting: Family Medicine

## 2023-08-16 DIAGNOSIS — J340 Abscess, furuncle and carbuncle of nose: Secondary | ICD-10-CM | POA: Diagnosis not present

## 2023-08-16 DIAGNOSIS — H52223 Regular astigmatism, bilateral: Secondary | ICD-10-CM | POA: Diagnosis not present

## 2023-08-16 MED ORDER — PREDNISONE 10 MG PO TABS: 30.0000 mg | ORAL_TABLET | Freq: Every day | ORAL | 0 refills | Status: AC

## 2023-08-16 MED ORDER — CLINDAMYCIN HCL 300 MG PO CAPS: 300.0000 mg | ORAL_CAPSULE | Freq: Three times a day (TID) | ORAL | 0 refills | Status: DC

## 2023-08-16 NOTE — Patient Instructions (Signed)
 Patient Plan for Post-Visit Guidance  1. Medications:    - Take clindamycin 300 mg by mouth three times a day for seven days.    - Take prednisone 30 mg once daily for five days.  2. Care Instructions:    - Keep the affected area clean and dry.    - Avoid scratching the area and allow natural drainage.  3. Monitoring:    - Watch for signs of a yeast infection. Contact us if you need Diflucan.    - Consider taking an over-the-counter probiotic.  4. Completion and Follow-up:    - Complete the full course of antibiotics.    - Contact us if symptoms persist beyond seven days.   Please follow these instructions and reach out if you have any questions or concerns.

## 2023-08-16 NOTE — Progress Notes (Signed)
 Primary Care / Sports Medicine Virtual Visit  Patient Information:  Patient ID: Brooke Graves, female DOB: 23-Nov-1975 Age: 48 y.o. MRN: [id number]   Brooke Graves Brooke Graves is a pleasant 48 y.o. female presenting with the following:  No chief complaint on file.   Review of Systems: No fevers, chills, night sweats, weight loss, chest pain, or shortness of breath.   Patient Active Problem List   Diagnosis Date Noted   Cellulitis of sidewall of nose 08/16/2023   Palpitations 03/11/2023   Memory loss 03/11/2023   Need for immunization against influenza 01/29/2022   Ganglion of lower leg 11/26/2021   Obesity, Class I, BMI 30-34.9 10/23/2021   Chronic left-sided low back pain with left-sided sciatica 09/25/2021   Encounter for weight management 08/28/2021   Acute laryngitis 05/13/2021   Sense of smell altered 03/12/2021   Routine screening for STI (sexually transmitted infection) 03/12/2021   Elevated blood-pressure reading without diagnosis of hypertension 02/06/2021   Hyperlipidemia 02/06/2021   Dietary counseling and surveillance 02/06/2021   Vitamin D deficiency 02/06/2021   Annual physical exam 02/06/2021   Psoriasis    Severe obesity (BMI 35.0-35.9 with comorbidity) (HCC) 02/21/2018   Past Medical History:  Diagnosis Date   Acquired absence of both breasts 05/08/2015   Allergy    BRCA positive    BRCA2 gene mutation positive 12/26/2018   Hyperlipidemia 02/06/2021   Iron deficiency anemia 06/11/2014   Plantar fasciitis    bilateral   Poisoning, snake bite 11/2019   [place]   Psoriasis    Tick bite of ankle    right   Outpatient Encounter Medications as of 08/16/2023  Medication Sig   clindamycin (CLEOCIN) 300 MG capsule Take 1 capsule (300 mg total) by mouth 3 (three) times daily for 7 days.   predniSONE (DELTASONE) 10 MG tablet Take 3 tablets (30 mg total) by mouth daily with breakfast for 5 days.   clobetasol ointment (TEMOVATE) 0.05 % Apply twice  daily for up to 2 weeks   Cyanocobalamin (VITAMIN B-12) 5000 MCG SUBL Place under the tongue. 1 ML   hydrOXYzine (VISTARIL) 25 MG capsule Take 1-2 capsules (25-50 mg total) by mouth every 8 (eight) hours as needed for anxiety.   Melatonin 10 MG CAPS Take by mouth.   RESTASIS 0.05 % ophthalmic emulsion Instill 1 drop  into affected eye(s) every 12 (twelve) hours   Saline Spray 0.2 % SOLN Place 1 spray into the nose 3 (three) times daily as needed.   Sodium Fluoride 1.1 % PSTE Use a pea sized amount with each brushing   No facility-administered encounter medications on file as of 08/16/2023.   Past Surgical History:  Procedure Laterality Date   ABDOMINAL HYSTERECTOMY  06/2014   ABDOMINOPLASTY  2006   BREAST ENHANCEMENT SURGERY Bilateral 03/2015   BREAST SURGERY Bilateral 12/18/2020   Revision   MASTECTOMY Bilateral 03/2015   ROTATOR CUFF REPAIR Right 02/2017   WISDOM TOOTH EXTRACTION Bilateral     Virtual Visit via MyChart Video:   I connected with Sabreena A Graves Graves on 08/16/23 via MyChart Video and verified that I am speaking with the correct person using appropriate identifiers.   The limitations, risks, security and privacy concerns of performing an evaluation and management service by MyChart Video, including the higher likelihood of inaccurate diagnoses and treatments, and the availability of in person appointments were reviewed. The possible need of an additional face-to-face encounter for complete and high quality delivery of  care was discussed. The patient was also made aware that there may be a patient responsible charge related to this service. The patient expressed understanding and wishes to proceed.  Provider location is in medical facility. Patient location is outside of their home, different from provider location. People involved in care of the patient during this telehealth encounter were myself, my nurse/medical assistant, and my front office/scheduling team  member.  Objective findings:   General: Speaking full sentences, no audible heavy breathing. Sounds alert and appropriately interactive. Well-appearing. Face symmetric. Extraocular movements intact. Pupils equal and round. No nasal flaring or accessory muscle use visualized.  Independent interpretation of notes and tests performed by another provider:   None  Pertinent History, Exam, Impression, and Recommendations:   Problem List Items Addressed This Visit     Cellulitis of sidewall of nose - Primary   History of Present Illness Brooke Graves is a 48 year old female who presents with symptoms of redness and swelling on her nose.  She presents with cellulitis on her nose, which began on Saturday 4/5 (3 days prior). The condition has worsened, with increased swelling and redness, and is accompanied by purulent discharge. No vision or throat issues, fever, or chills are reported.  The symptoms initially started with a swollen lymph node on the right side of her neck, progressing to both sides by the following morning. No fever, or chills are noted.  She attempted to use Benadryl cream at the guidance of her dentist without improvement.   During video visit  Patient supplied images in chart  Assessment and Plan Nasal Cellulitis Cellulitis of the nose likely due to staphylococcal infection. Will cover for MRSA given healthcare field. Clindamycin for MRSA coverage due to Bactrim allergy. Adjunct prednisone to reduce inflammation - Prescribe clindamycin 300 mg orally three times a day for seven days. - Prescribe prednisone 30 mg daily for five days. - Advise to keep area clean and dry, avoid scratching, allow natural drainage. - Monitor for yeast infection; contact for Diflucan if needed. - Recommend over-the-counter probiotic. - Instruct to complete full antibiotic course. - Advise to contact if symptoms persist beyond seven days.      Relevant Medications    clindamycin (CLEOCIN) 300 MG capsule   predniSONE (DELTASONE) 10 MG tablet     Orders & Medications Medications:  Meds ordered this encounter  Medications   clindamycin (CLEOCIN) 300 MG capsule    Sig: Take 1 capsule (300 mg total) by mouth 3 (three) times daily for 7 days.    Dispense:  21 capsule    Refill:  0   predniSONE (DELTASONE) 10 MG tablet    Sig: Take 3 tablets (30 mg total) by mouth daily with breakfast for 5 days.    Dispense:  15 tablet    Refill:  0   No orders of the defined types were placed in this encounter.    I discussed the above assessment and treatment plan with the patient. The patient was provided an opportunity to ask questions and all were answered. The patient agreed with the plan and demonstrated an understanding of the instructions.   The patient was advised to call back or seek an in-person evaluation if the symptoms worsen or if the condition fails to improve as anticipated.   I provided a total time of 30 minutes including both face-to-face and non-face-to-face time on 08/16/2023 inclusive of time utilized for medical chart review, information gathering, care coordination with staff, and documentation  completion.    Jerrol Banana, MD, [facility]   Primary Care Sports Medicine Primary Care and Sports Medicine at [facility]

## 2023-08-16 NOTE — Assessment & Plan Note (Addendum)
 History of Present Illness Brooke Graves Vikki Ports is a 48 year old female who presents with symptoms of redness and swelling on her nose.  She presents with cellulitis on her nose, which began on Saturday 4/5 (3 days prior). The condition has worsened, with increased swelling and redness, and is accompanied by purulent discharge. No vision or throat issues, fever, or chills are reported.  The symptoms initially started with a swollen lymph node on the right side of her neck, progressing to both sides by the following morning. No fever, or chills are noted.  She attempted to use Benadryl cream at the guidance of her dentist without improvement.   During video visit  Patient supplied images in chart  Assessment and Plan Nasal Cellulitis Cellulitis of the nose likely due to staphylococcal infection. Will cover for MRSA given healthcare field. Clindamycin for MRSA coverage due to Bactrim allergy. Adjunct prednisone to reduce inflammation - Prescribe clindamycin 300 mg orally three times a day for seven days. - Prescribe prednisone 30 mg daily for five days. - Advise to keep area clean and dry, avoid scratching, allow natural drainage. - Monitor for yeast infection; contact for Diflucan if needed. - Recommend over-the-counter probiotic. - Instruct to complete full antibiotic course. - Advise to contact if symptoms persist beyond seven days.

## 2023-08-22 NOTE — Telephone Encounter (Signed)
 Please review.  KP

## 2023-08-23 ENCOUNTER — Other Ambulatory Visit: Payer: Self-pay | Admitting: Family Medicine

## 2023-08-23 DIAGNOSIS — J340 Abscess, furuncle and carbuncle of nose: Secondary | ICD-10-CM

## 2023-08-23 MED ORDER — CLINDAMYCIN HCL 300 MG PO CAPS: 300.0000 mg | ORAL_CAPSULE | Freq: Three times a day (TID) | ORAL | 0 refills | Status: AC

## 2023-08-29 NOTE — Telephone Encounter (Signed)
 Please review.  KP

## 2023-09-13 DIAGNOSIS — F419 Anxiety disorder, unspecified: Secondary | ICD-10-CM | POA: Diagnosis not present

## 2023-11-07 ENCOUNTER — Ambulatory Visit: Payer: Self-pay

## 2023-11-07 ENCOUNTER — Encounter: Payer: Self-pay | Admitting: Family Medicine

## 2023-11-07 ENCOUNTER — Ambulatory Visit: Admitting: Family Medicine

## 2023-11-07 VITALS — BP 116/80 | HR 75 | Ht 67.0 in | Wt 227.0 lb

## 2023-11-07 DIAGNOSIS — N3001 Acute cystitis with hematuria: Secondary | ICD-10-CM | POA: Insufficient documentation

## 2023-11-07 DIAGNOSIS — R350 Frequency of micturition: Secondary | ICD-10-CM | POA: Diagnosis not present

## 2023-11-07 LAB — POCT URINALYSIS DIPSTICK
Glucose, UA: NEGATIVE
Ketones, UA: NEGATIVE
Nitrite, UA: NEGATIVE
Protein, UA: POSITIVE — AB
Spec Grav, UA: 1.01 (ref 1.010–1.025)
Urobilinogen, UA: 0.2 U/dL
pH, UA: 6 (ref 5.0–8.0)

## 2023-11-07 MED ORDER — NITROFURANTOIN MONOHYD MACRO 100 MG PO CAPS: 100.0000 mg | ORAL_CAPSULE | Freq: Two times a day (BID) | ORAL | 0 refills | Status: DC

## 2023-11-07 MED ORDER — PHENAZOPYRIDINE HCL 200 MG PO TABS: 200.0000 mg | ORAL_TABLET | Freq: Three times a day (TID) | ORAL | 0 refills | Status: AC

## 2023-11-07 MED ORDER — FLUCONAZOLE 150 MG PO TABS: 150.0000 mg | ORAL_TABLET | Freq: Once | ORAL | 0 refills | Status: AC

## 2023-11-07 NOTE — Assessment & Plan Note (Signed)
 History of Present Illness Brooke Graves is a 48 year old female who presents with symptoms of a urinary tract infection.  Lower urinary tract symptoms - Onset of symptoms around midnight - Sharp urethral pain initially, transitioning to a dull ache - Urinary frequency, spasms, and cramping - Pressure sensation in the lower urinary tract - Reddish discoloration of urine when wiping - No fever or chills - Mild discomfort in the right flank area  Antibiotic-associated yeast infections - History of developing yeast infections when taking antibiotics  Sexual activity and uti risk factors - Sexual intercourse with long-term partner on Thursday prior to symptom onset - Usually urinates post-intercourse but is unsure if she did so this time  Physical Exam ABDOMEN: Abdomen is soft, nontender, and nondistended. Normoactive bowel sounds. Mild right CVA tenderness. No left CVA tenderness. No suprapubic tenderness.  Results LABS Urinalysis: Hematuria, Leukocyturia (11/07/2023)  Assessment and Plan Urinary tract infection (UTI) Acute UTI with pain, urinary frequency, spasms, and hematuria. Urinalysis indicates likely infection. No systemic symptoms. - Prescribe Macrobid for 7 days. - Send urine for culture to confirm organism and sensitivity. - Advise increased hydration. - Prescribe phenazopyridine for pain relief, 200 mg TID for up to 2 days. - Advise OTC Azo if Rx Pyridium not covered.  Yeast infection risk due to antibiotics Increased yeast infection risk due to antibiotics. History of yeast infections with antibiotics. - Use prescribed fluconazole at first sign of yeast infection symptoms.

## 2023-11-07 NOTE — Patient Instructions (Signed)
 Patient Action Plan  Urinary Tract Infection (UTI): - Take Macrobid as prescribed for 7 days. - Increase your water intake to stay hydrated. - Use phenazopyridine for pain relief, 200 mg three times a day for up to 2 days. If not covered, consider OTC Azo. - A urine culture has been ordered to confirm the infection type.  Yeast Infection Risk: - Be aware of the increased risk of yeast infections due to antibiotics. Use fluconazole at the first sign of yeast infection symptoms.  Red Flags: - If you develop a fever, chills, or severe flank pain, seek medical attention promptly.

## 2023-11-07 NOTE — Telephone Encounter (Signed)
 FYI Only or Action Required?: FYI only for provider.  Patient was last seen in primary care on 08/16/2023 by Alvia Selinda PARAS, MD. Called Nurse Triage reporting Abdominal Pain. Symptoms began today. Interventions attempted: Nothing. Symptoms are: unchanged.  Triage Disposition: See Physician Within 24 Hours  Patient/caregiver understands and will follow disposition?: Yes, will follow disposition  Copied from CRM [id number]. Topic: Clinical - Red Word Triage >> Nov 07, 2023 10:23 AM Delon HERO wrote: Red Word that prompted transfer to Nurse Triage: Patient is calling to report lower abdominal pain, pain with urination, no cloudy urine, frequent urination, no odor. Reason for Disposition  All other patients with painful urination  (Exception: [1] EITHER frequency or urgency AND [2] has on-call doctor.)  Answer Assessment - Initial Assessment Questions 1. SEVERITY: How bad is the pain?  (e.g., Scale 1-10; mild, moderate, or severe)   - MILD (1-3): complains slightly about urination hurting   - MODERATE (4-7): interferes with normal activities     - SEVERE (8-10): excruciating, unwilling or unable to urinate because of the pain      4 2. FREQUENCY: How many times have you had painful urination today?      Since this AM, q 15 min 3. PATTERN: Is pain present every time you urinate or just sometimes?      Every time 4. ONSET: When did the painful urination start?      This morning 5. FEVER: Do you have a fever? If Yes, ask: What is your temperature, how was it measured, and when did it start?     denies 6. PAST UTI: Have you had a urine infection before? If Yes, ask: When was the last time? and What happened that time?      2 in life time 7. CAUSE: What do you think is causing the painful urination?  (e.g., UTI, scratch, Herpes sore)     UTI 8. OTHER SYMPTOMS: Do you have any other symptoms? (e.g., blood in urine, flank pain, genital sores, urgency, vaginal discharge)      denies  Protocols used: Urination Pain - Female-A-AH

## 2023-11-07 NOTE — Telephone Encounter (Signed)
 Noted  Pt has appt  KP

## 2023-11-07 NOTE — Progress Notes (Signed)
 Primary Care / Sports Medicine Office Visit  Patient Information:  Patient ID: Brooke Graves, female DOB: [birth year] Age: 48 y.o. MRN: [id number]   Brooke Graves is a pleasant 48 y.o. female presenting with the following:  Chief Complaint  Patient presents with   Urinary Tract Infection    Frequent urination since this morning. Dysuria, mild back pain and abdominal pain.    Vitals:   11/07/23 1451  BP: 116/80  Pulse: 75  SpO2: 96%   Vitals:   11/07/23 1451  Weight: 227 lb (103 kg)  Height: 5' 7 (1.702 m)   Body mass index is 35.55 kg/m.  No results found.   Independent interpretation of notes and tests performed by another provider:   None  Procedures performed:   None  Pertinent History, Exam, Impression, and Recommendations:   Problem List Items Addressed This Visit     Acute cystitis with hematuria - Primary   History of Present Illness Brooke Graves is a 48 year old female who presents with symptoms of a urinary tract infection.  Lower urinary tract symptoms - Onset of symptoms around midnight - Sharp urethral pain initially, transitioning to a dull ache - Urinary frequency, spasms, and cramping - Pressure sensation in the lower urinary tract - Reddish discoloration of urine when wiping - No fever or chills - Mild discomfort in the right flank area  Antibiotic-associated yeast infections - History of developing yeast infections when taking antibiotics  Sexual activity and uti risk factors - Sexual intercourse with long-term partner on Thursday prior to symptom onset - Usually urinates post-intercourse but is unsure if she did so this time  Physical Exam ABDOMEN: Abdomen is soft, nontender, and nondistended. Normoactive bowel sounds. Mild right CVA tenderness. No left CVA tenderness. No suprapubic tenderness.  Results LABS Urinalysis: Hematuria, Leukocyturia (11/07/2023)  Assessment and Plan Urinary tract  infection (UTI) Acute UTI with pain, urinary frequency, spasms, and hematuria. Urinalysis indicates likely infection. No systemic symptoms. - Prescribe Macrobid for 7 days. - Send urine for culture to confirm organism and sensitivity. - Advise increased hydration. - Prescribe phenazopyridine for pain relief, 200 mg TID for up to 2 days. - Advise OTC Azo if Rx Pyridium not covered.  Yeast infection risk due to antibiotics Increased yeast infection risk due to antibiotics. History of yeast infections with antibiotics. - Use prescribed fluconazole at first sign of yeast infection symptoms.      Relevant Orders   POCT urinalysis dipstick (Completed)   Urine Culture     Orders & Medications Medications:  Meds ordered this encounter  Medications   phenazopyridine (PYRIDIUM) 200 MG tablet    Sig: Take 1 tablet (200 mg total) by mouth 3 (three) times daily for 2 days.    Dispense:  6 tablet    Refill:  0   fluconazole (DIFLUCAN) 150 MG tablet    Sig: Take 1 tablet (150 mg total) by mouth once for 1 dose.    Dispense:  1 tablet    Refill:  0   nitrofurantoin, macrocrystal-monohydrate, (MACROBID) 100 MG capsule    Sig: Take 1 capsule (100 mg total) by mouth 2 (two) times daily.    Dispense:  14 capsule    Refill:  0   Orders Placed This Encounter  Procedures   Urine Culture   POCT urinalysis dipstick     No follow-ups on file.     Keatyn Jawad J Nabor Thomann, MD, CAQSM  Primary Care Sports Medicine Primary Care and Sports Medicine at [facility]

## 2023-11-09 LAB — URINE CULTURE: Organism ID, Bacteria: NO GROWTH

## 2023-12-19 DIAGNOSIS — F419 Anxiety disorder, unspecified: Secondary | ICD-10-CM | POA: Diagnosis not present

## 2024-01-06 LAB — COMPREHENSIVE METABOLIC PANEL WITH GFR
Albumin: 4.5 (ref 3.5–5.0)
Calcium: 9.5 (ref 8.7–10.7)
eGFR: 108

## 2024-01-06 LAB — CBC: RBC: 5.04 (ref 3.87–5.11)

## 2024-01-06 LAB — CBC AND DIFFERENTIAL
HCT: 43 (ref 36–46)
Hemoglobin: 14.5 (ref 12.0–16.0)
WBC: 5.5

## 2024-01-06 LAB — TSH: TSH: 1.22 (ref 0.41–5.90)

## 2024-01-06 LAB — LIPID PANEL
Cholesterol: 238 — AB (ref 0–200)
HDL: 58 (ref 35–70)
LDL Cholesterol: 160
Triglycerides: 101 (ref 40–160)

## 2024-01-06 LAB — HEPATIC FUNCTION PANEL
ALT: 18 U/L (ref 7–35)
AST: 17 (ref 13–35)
Alkaline Phosphatase: 80 (ref 25–125)
Bilirubin, Total: 0.7

## 2024-01-06 LAB — BASIC METABOLIC PANEL WITH GFR
BUN: 12 (ref 4–21)
Chloride: 102 (ref 99–108)
Creatinine: 0.6 (ref 0.5–1.1)
Glucose: 82
Potassium: 4 meq/L (ref 3.5–5.1)
Sodium: 141 (ref 137–147)

## 2024-01-06 LAB — POCT INR: INR: 1 (ref 0.80–1.20)

## 2024-01-06 LAB — HEMOGLOBIN A1C: Hemoglobin A1C: 5.4

## 2024-01-25 DIAGNOSIS — F419 Anxiety disorder, unspecified: Secondary | ICD-10-CM | POA: Diagnosis not present

## 2024-02-03 ENCOUNTER — Encounter: Payer: Self-pay | Admitting: Family Medicine

## 2024-02-03 ENCOUNTER — Ambulatory Visit (INDEPENDENT_AMBULATORY_CARE_PROVIDER_SITE_OTHER): Admitting: Family Medicine

## 2024-02-03 VITALS — BP 124/80 | HR 70 | Ht 67.0 in | Wt 230.4 lb

## 2024-02-03 DIAGNOSIS — M7662 Achilles tendinitis, left leg: Secondary | ICD-10-CM | POA: Diagnosis not present

## 2024-02-03 DIAGNOSIS — L409 Psoriasis, unspecified: Secondary | ICD-10-CM

## 2024-02-03 DIAGNOSIS — F32A Depression, unspecified: Secondary | ICD-10-CM | POA: Insufficient documentation

## 2024-02-03 DIAGNOSIS — M76811 Anterior tibial syndrome, right leg: Secondary | ICD-10-CM | POA: Diagnosis not present

## 2024-02-03 DIAGNOSIS — E559 Vitamin D deficiency, unspecified: Secondary | ICD-10-CM

## 2024-02-03 DIAGNOSIS — E66812 Obesity, class 2: Secondary | ICD-10-CM

## 2024-02-03 DIAGNOSIS — Z0000 Encounter for general adult medical examination without abnormal findings: Secondary | ICD-10-CM | POA: Insufficient documentation

## 2024-02-03 DIAGNOSIS — Z6836 Body mass index (BMI) 36.0-36.9, adult: Secondary | ICD-10-CM

## 2024-02-03 DIAGNOSIS — F419 Anxiety disorder, unspecified: Secondary | ICD-10-CM

## 2024-02-03 DIAGNOSIS — R400 Somnolence: Secondary | ICD-10-CM | POA: Insufficient documentation

## 2024-02-03 NOTE — Assessment & Plan Note (Signed)
 Musculoskeletal pain - 'Weird zing pain' below right knee for approximately one month, aggravated by kneeling and certain movements  Right anterior tibialis tendonitis Pain localized to the right anterior tibialis, mild, activity-related, no functional impairment. - Recommend ice application for pain relief. - Suggest topical lidocaine or Voltaren gel if pain persists. - Provide exercises to strengthen and stretch the anterior tibialis.

## 2024-02-03 NOTE — Assessment & Plan Note (Signed)
 Psoriasis Psoriasis flares during stress, current clobetasol  effective but messy. Discussed Zoryve foam as an alternative treatment. - Provide sample of Zoryve foam. - Prescribe Zoryve if effective. - Refill clobetasol  if preferred. Contact us  for this.

## 2024-02-03 NOTE — Assessment & Plan Note (Signed)
 Behavioral Health Symptoms managed with fluoxetine, improved sleep quality, disciplined sleep schedule contributing to improvement.

## 2024-02-03 NOTE — Assessment & Plan Note (Addendum)
 Sleep disturbance and daytime fatigue - Dry throat upon waking - Mild headaches - Excessive daytime sleepiness - Occasional snoring - Disciplined sleep schedule: bedtime by 10:30 PM, consistent wake time  Suspected obstructive sleep apnea Positive screening for sleep apnea. Discussed commonality, treatability, CPAP benefits, and risks of untreated sleep apnea. Potential Zepbound medication if confirmed moderate or severe. - Refer to sleep medicine specialist for evaluation and potential sleep study.

## 2024-02-03 NOTE — Assessment & Plan Note (Signed)
 Musculoskeletal pain - Pain on the left side of the heel, onset after a trail walk with her dog, possibly due to a misstep  Left Achilles tendonitis Pain in the left Achilles tendon, mild, activity-related. - Provide exercises to strengthen and stretch the Achilles tendon.

## 2024-02-03 NOTE — Patient Instructions (Addendum)
-   Obtain fasting labs with orders provided (can have water or black coffee but otherwise no food or drink x 8 hours before labs) - Review information provided - Attend eye doctor annually, dentist every 6 months, work towards or maintain 30 minutes of moderate intensity physical activity at least 5 days per week, and consume a balanced diet - Return in 1 year for physical - Contact us  for any questions between now and then   VISIT SUMMARY:  Today, we discussed your ongoing participation in a clinical trial, recent weight gain, sleep disturbances, psoriasis, musculoskeletal pain, and other health concerns. We reviewed your lab results and made plans to address each of your issues.  YOUR PLAN:  RIGHT ANTERIOR TIBIALIS TENDONITIS: You have mild pain in your right anterior tibialis, which is related to activity but does not impair your function. -Apply ice to the affected area for pain relief. -Use topical lidocaine or Voltaren gel if the pain persists. -Perform exercises to strengthen and stretch the anterior tibialis.  LEFT ACHILLES TENDONITIS: You have mild pain in your left Achilles tendon, which is related to activity. -Perform exercises to strengthen and stretch the Achilles tendon.  SUSPECTED OBSTRUCTIVE SLEEP APNEA: You screened positive for sleep apnea, which is common and treatable. We discussed the benefits of CPAP therapy and the risks of untreated sleep apnea. -You will be referred to a sleep medicine specialist for further evaluation and a potential sleep study.  OBESITY: You have experienced weight gain after stopping GLP-1 medication. We discussed the option of bariatric surgery and the importance of lifestyle changes. -You will be referred to [facility] for a consultation.  PSORIASIS: You experience psoriasis flare-ups during periods of high stress. Your current treatment with clobetasol  is effective but messy. -You will be provided with a sample of Zoryve to try as an  alternative treatment. -If Zoryve is effective, it will be prescribed. -If you prefer, clobetasol  can be refilled.  VITAMIN D  DEFICIENCY: You have a vitamin D  deficiency and are currently taking supplements. It is important to maintain your vitamin D  levels above 30 ng/mL. -A vitamin D  level test will be ordered. -We will discuss potential insurance coverage for vitamin D  testing.  BEHAVIORAL HEALTH: Your symptoms are being managed with fluoxetine, and your sleep quality has improved. -Continue your current medication and sleep schedule.  HISTORY OF BILATERAL MASTECTOMY AND TOTAL HYSTERECTOMY WITH BILATERAL OOPHORECTOMY FOR BRCA2 MUTATION: You had a bilateral mastectomy and total hysterectomy with bilateral oophorectomy in 2016 due to a BRCA2 mutation. You do not need a mammogram, but you do need routine Pap exams because your cervix is still present. -Schedule a Pap exam within 1-2 months. -Schedule your annual physical exam.

## 2024-02-03 NOTE — Progress Notes (Signed)
 Annual Physical Exam Visit  Patient Information:  Patient ID: Brooke Graves, female DOB: February 26, 1976 Age: 48 y.o. MRN: [id number]   Subjective:   CC: Annual Physical Exam  HPI:  Brooke Graves is here for their annual physical.  I reviewed the past medical history, family history, social history, surgical history, and allergies today and changes were made as necessary.  Please see the problem list section below for additional details.  Past Medical History: Past Medical History:  Diagnosis Date   Acquired absence of both breasts 05/08/2015   Allergy    BRCA positive    BRCA2 gene mutation positive 12/26/2018   Hyperlipidemia 02/06/2021   Iron deficiency anemia 06/11/2014   Plantar fasciitis    bilateral   Poisoning, snake bite 11/2019   [place]   Psoriasis    Tick bite of ankle    right   Past Surgical History: Past Surgical History:  Procedure Laterality Date   ABDOMINAL HYSTERECTOMY  06/2014   Cervix present - needs PAP routine   ABDOMINOPLASTY  2006   BREAST ENHANCEMENT SURGERY Bilateral 03/2015   BREAST SURGERY Bilateral 12/18/2020   Revision   MASTECTOMY Bilateral 03/2015   ROTATOR CUFF REPAIR Right 02/2017   WISDOM TOOTH EXTRACTION Bilateral    Family History: Family History  Problem Relation Age of Onset   Breast cancer Mother    Congenital heart disease Brother    Ovarian cancer Maternal Aunt    Kidney cancer Maternal Aunt    Breast cancer Maternal Grandmother    Allergies: Allergies  Allergen Reactions   Sulfamethoxazole-Trimethoprim Palpitations, Rash and Other (See Comments)   Other Other (See Comments)   Pork Allergy Itching   Bacid Other (See Comments)    GI INTOLERANCE   Pork-Derived Products Other (See Comments)    GI INTOLERANCE   Tape Rash   Wound Dressing Adhesive Rash   Health Maintenance: Health Maintenance  Topic Date Due   Cervical Cancer Screening (HPV/Pap Cotest)  Never done   Colonoscopy  Never  done   Influenza Vaccine  12/09/2023   Zoster Vaccines- Shingrix (1 of 2) 05/04/2024 (Originally 10/06/2023)   Pneumococcal Vaccine: 50+ Years (1 of 1 - PCV) 02/02/2025 (Originally 10/06/2023)   Hepatitis B Vaccines 19-59 Average Risk (1 of 3 - 19+ 3-dose series) 02/02/2025 (Originally 10/05/1992)   DTaP/Tdap/Td (2 - Td or Tdap) 10/28/2032   COVID-19 Vaccine  Completed   Hepatitis C Screening  Completed   HIV Screening  Completed   HPV VACCINES  Aged Out   Meningococcal B Vaccine  Aged Out    HM Colonoscopy          Current Care Gaps     Colonoscopy (Every 10 Years) Never done   No completion history exists for this topic.                Medications: Current Outpatient Medications on File Prior to Visit  Medication Sig Dispense Refill   ascorbic acid (VITAMIN C) 1000 MG tablet Take 2,000 mg by mouth daily.     Cholecalciferol (VITAMIN D3) 50 MCG (2000 UT) CHEW Chew 50 mcg by mouth daily.     clobetasol  ointment (TEMOVATE ) 0.05 % Apply twice daily for up to 2 weeks 30 g 0   Cyanocobalamin (VITAMIN B-12) 5000 MCG SUBL Place under the tongue. 1 ML (Patient taking differently: Take 1,000 mcg by mouth daily. 1 ML)     FLUoxetine (PROZAC) 10 MG capsule Take 10  mg by mouth daily.     Gotu Kola, Centella asiatica, (GOTU KOLA PO) Take 1,500 mg by mouth daily.     hydrOXYzine  (VISTARIL ) 25 MG capsule Take 1-2 capsules (25-50 mg total) by mouth every 8 (eight) hours as needed for anxiety. 30 capsule 1   Magnesium Glycinate 100 MG CAPS Take 500 mg by mouth daily.     omega-3 acid ethyl esters (LOVAZA) 1 g capsule Take 1 g by mouth daily.     Saline Spray 0.2 % SOLN Place 1 spray into the nose 3 (three) times daily as needed.     Sodium Fluoride  1.1 % PSTE Use a pea sized amount with each brushing 100 mL PRN   zinc gluconate 50 MG tablet Take 50 mg by mouth daily.     Melatonin 10 MG CAPS Take by mouth.     nitrofurantoin , macrocrystal-monohydrate, (MACROBID ) 100 MG capsule Take 1  capsule (100 mg total) by mouth 2 (two) times daily. 14 capsule 0   RESTASIS  0.05 % ophthalmic emulsion Instill 1 drop  into affected eye(s) every 12 (twelve) hours 180 each 4   No current facility-administered medications on file prior to visit.    Discussed the use of AI scribe software for clinical note transcription with the patient, who gave verbal consent to proceed.   Objective:   Vitals:   02/03/24 1039  BP: 124/80  Pulse: 70  SpO2: 99%   Vitals:   02/03/24 1039  Weight: 230 lb 6.4 oz (104.5 kg)  Height: 5' 7 (1.702 m)   Body mass index is 36.09 kg/m.  General: Well Developed, well nourished, and in no acute distress.  Neuro: Alert and oriented x3, extra-ocular muscles intact, sensation grossly intact. Cranial nerves II through XII are grossly intact, motor, sensory, and coordinative functions are intact. HEENT: Normocephalic, atraumatic, neck supple, no masses, no lymphadenopathy, thyroid  nonenlarged. Oropharynx, nasopharynx, external ear canals are unremarkable. Skin: Warm and dry, no rashes noted.  Cardiac: Regular rate and rhythm, no murmurs rubs or gallops. No peripheral edema. Pulses symmetric. Respiratory: Clear to auscultation bilaterally. Speaking in full sentences.  Abdominal: Soft, nontender, nondistended, positive bowel sounds, no masses, no organomegaly. Musculoskeletal: Stable, and with full range of motion.  Impression and Recommendations:   The patient was counselled, risk factors were discussed, and anticipatory guidance given.  Problem List Items Addressed This Visit     Achilles tendinitis, left leg   Musculoskeletal pain - Pain on the left side of the heel, onset after a trail walk with her dog, possibly due to a misstep  Left Achilles tendonitis Pain in the left Achilles tendon, mild, activity-related. - Provide exercises to strengthen and stretch the Achilles tendon.      Anterior tibial tendonitis, right   Musculoskeletal pain - 'Weird  zing pain' below right knee for approximately one month, aggravated by kneeling and certain movements  Right anterior tibialis tendonitis Pain localized to the right anterior tibialis, mild, activity-related, no functional impairment. - Recommend ice application for pain relief. - Suggest topical lidocaine or Voltaren gel if pain persists. - Provide exercises to strengthen and stretch the anterior tibialis.      Anxiety and depression   Behavioral Health Symptoms managed with fluoxetine, improved sleep quality, disciplined sleep schedule contributing to improvement.      Class 2 obesity due to excess calories with body mass index (BMI) of 36.0 to 36.9 in adult    Obesity Weight gain post-GLP-1 cessation. Discussed bariatric surgery options and importance  of lifestyle changes. Noted brother's positive experience with sleeve gastrectomy. Discussed potential dual therapy with GLP-1s and surgery. - Refer to [facility] for consultation.      Relevant Orders   Amb Referral to Bariatric Surgery   Daytime somnolence   Sleep disturbance and daytime fatigue - Dry throat upon waking - Mild headaches - Excessive daytime sleepiness - Occasional snoring - Disciplined sleep schedule: bedtime by 10:30 PM, consistent wake time  Suspected obstructive sleep apnea Positive screening for sleep apnea. Discussed commonality, treatability, CPAP benefits, and risks of untreated sleep apnea. Potential Zepbound medication if confirmed moderate or severe. - Refer to sleep medicine specialist for evaluation and potential sleep study.      Relevant Orders   Ambulatory referral to Sleep Studies   Healthcare maintenance - Primary   Annual examination completed, risk stratification labs ordered, anticipatory guidance provided.  We will follow labs once resulted.      Relevant Orders   CBC   Comprehensive metabolic panel with GFR   Hemoglobin A1c   Lipid panel   VITAMIN D  25 Hydroxy (Vit-D  Deficiency, Fractures)   Psoriasis   Psoriasis Psoriasis flares during stress, current clobetasol  effective but messy. Discussed Zoryve foam as an alternative treatment. - Provide sample of Zoryve foam. - Prescribe Zoryve if effective. - Refill clobetasol  if preferred. Contact us  for this.      Vitamin D  deficiency   Vitamin d  deficiency - Vitamin D  deficiency identified on laboratory studies in 2023 - Currently taking daily vitamin D  supplementation      Relevant Orders   VITAMIN D  25 Hydroxy (Vit-D Deficiency, Fractures)     Orders & Medications Medications: No orders of the defined types were placed in this encounter.  Orders Placed This Encounter  Procedures   CBC   Comprehensive metabolic panel with GFR   Hemoglobin A1c   Lipid panel   VITAMIN D  25 Hydroxy (Vit-D Deficiency, Fractures)   Ambulatory referral to Sleep Studies   Amb Referral to Bariatric Surgery     Return in about 1 year (around 02/02/2025) for CPE.    [name], MD, [facility]   Primary Care Sports Medicine Primary Care and Sports Medicine at [facility]

## 2024-02-03 NOTE — Assessment & Plan Note (Signed)
 Annual examination completed, risk stratification labs ordered, anticipatory guidance provided.  We will follow labs once resulted.

## 2024-02-03 NOTE — Assessment & Plan Note (Signed)
 Vitamin d  deficiency - Vitamin D  deficiency identified on laboratory studies in 2023 - Currently taking daily vitamin D  supplementation

## 2024-02-03 NOTE — Assessment & Plan Note (Signed)
  Obesity Weight gain post-GLP-1 cessation. Discussed bariatric surgery options and importance of lifestyle changes. Noted brother's positive experience with sleeve gastrectomy. Discussed potential dual therapy with GLP-1s and surgery. - Refer to [facility] for consultation.

## 2024-02-14 ENCOUNTER — Encounter: Payer: Self-pay | Admitting: Family Medicine

## 2024-02-14 NOTE — Telephone Encounter (Signed)
 FYI- labs have been abstracted.  KP

## 2024-02-20 ENCOUNTER — Encounter: Payer: Self-pay | Admitting: Family Medicine

## 2024-02-20 DIAGNOSIS — F419 Anxiety disorder, unspecified: Secondary | ICD-10-CM | POA: Diagnosis not present

## 2024-02-20 NOTE — Telephone Encounter (Signed)
 Please review and advise.  JM

## 2024-03-01 ENCOUNTER — Encounter: Payer: Self-pay | Admitting: Family Medicine

## 2024-03-01 ENCOUNTER — Ambulatory Visit: Admitting: Family Medicine

## 2024-03-01 ENCOUNTER — Ambulatory Visit: Admitting: Sleep Medicine

## 2024-03-01 ENCOUNTER — Other Ambulatory Visit (HOSPITAL_COMMUNITY)
Admission: RE | Admit: 2024-03-01 | Discharge: 2024-03-01 | Disposition: A | Source: Ambulatory Visit | Attending: Family Medicine | Admitting: Family Medicine

## 2024-03-01 VITALS — BP 130/68 | HR 70 | Temp 98.8°F | Ht 67.0 in | Wt 235.0 lb

## 2024-03-01 DIAGNOSIS — Z124 Encounter for screening for malignant neoplasm of cervix: Secondary | ICD-10-CM

## 2024-03-01 DIAGNOSIS — Z0000 Encounter for general adult medical examination without abnormal findings: Secondary | ICD-10-CM | POA: Insufficient documentation

## 2024-03-05 NOTE — Assessment & Plan Note (Addendum)
 Pap examination performed today.  Genitourinary: Normal appearing vulva. No lesions noted. Speculum examination: Normal appearing cervix. No blood in the vaginal vault. No discharge.  Female chaperone initials: JM  Will reach out with results once available and that will dictate screening interval.

## 2024-03-05 NOTE — Progress Notes (Signed)
     Primary Care / Sports Medicine Office Visit  Patient Information:  Patient ID: Brooke Graves, female DOB: [birth year] Age: 48 y.o. MRN: [id number]   Brooke Graves is a pleasant 48 y.o. female presenting with the following:  Chief Complaint  Patient presents with   Gynecologic Exam    Patient presents today for her PAP    Vitals:   03/01/24 1508  BP: 130/68  Pulse: 70  Temp: 98.8 F (37.1 C)  SpO2: 97%   Vitals:   03/01/24 1508  Weight: 235 lb (106.6 kg)  Height: 5' 7 (1.702 m)   Body mass index is 36.81 kg/m.  No results found.   Discussed the use of AI scribe software for clinical note transcription with the patient, who gave verbal consent to proceed.   Independent interpretation of notes and tests performed by another provider:   None  Procedures performed:   None  Pertinent History, Exam, Impression, and Recommendations:   Problem List Items Addressed This Visit     Healthcare maintenance - Primary   Pap examination performed today.  Genitourinary: Normal appearing vulva. No lesions noted. Speculum examination: Normal appearing cervix. No blood in the vaginal vault. No discharge.  Female chaperone initials: JM  Will reach out with results once available and that will dictate screening interval.        Orders & Medications Medications: No orders of the defined types were placed in this encounter.  No orders of the defined types were placed in this encounter.    No follow-ups on file.     [name], MD, [facility]   Primary Care Sports Medicine Primary Care and Sports Medicine at [facility]

## 2024-03-06 ENCOUNTER — Ambulatory Visit: Payer: Self-pay | Admitting: Family Medicine

## 2024-03-06 DIAGNOSIS — R87622 Low grade squamous intraepithelial lesion on cytologic smear of vagina (LGSIL): Secondary | ICD-10-CM

## 2024-03-06 LAB — CYTOLOGY - PAP: Adequacy: ABSENT

## 2024-03-19 DIAGNOSIS — F419 Anxiety disorder, unspecified: Secondary | ICD-10-CM | POA: Diagnosis not present

## 2024-03-20 ENCOUNTER — Telehealth: Payer: Self-pay

## 2024-03-20 NOTE — Telephone Encounter (Signed)
 Copied from CRM [id number]. Topic: General - Billing Inquiry >> Mar 20, 2024  8:49 AM Myrick T wrote: Reason for CRM: patient called stated she was told there was not enough time to do her pap at her physical and to come back but it would the pap would be coded as her physical. She recvd a bill and was charged $35. She called to find out why and was advised it was because of the results. Patient wants to make sure the date of service 03-05-24 was coded correctly and filed back to her insurance as part of her physical. Please f/u with patient

## 2024-03-20 NOTE — Telephone Encounter (Signed)
 Can you get billing to look into this? Thank you.  JM

## 2024-03-22 DIAGNOSIS — R87622 Low grade squamous intraepithelial lesion on cytologic smear of vagina (LGSIL): Secondary | ICD-10-CM | POA: Diagnosis not present

## 2024-03-22 DIAGNOSIS — Z1330 Encounter for screening examination for mental health and behavioral disorders, unspecified: Secondary | ICD-10-CM | POA: Diagnosis not present

## 2024-04-11 DIAGNOSIS — F419 Anxiety disorder, unspecified: Secondary | ICD-10-CM | POA: Diagnosis not present

## 2024-04-17 NOTE — Telephone Encounter (Signed)
 Spoke with patient and informed her it should be correct now. She was happy about this and thankful for the call.  JM

## 2024-04-18 DIAGNOSIS — R6884 Jaw pain: Secondary | ICD-10-CM | POA: Diagnosis not present

## 2024-06-04 ENCOUNTER — Other Ambulatory Visit: Payer: Self-pay | Admitting: Medical Genetics

## 2024-06-08 ENCOUNTER — Other Ambulatory Visit
Admission: RE | Admit: 2024-06-08 | Discharge: 2024-06-08 | Disposition: A | Payer: Self-pay | Source: Ambulatory Visit | Attending: Medical Genetics | Admitting: Medical Genetics

## 2025-02-04 ENCOUNTER — Encounter: Admitting: Family Medicine
